# Patient Record
Sex: Female | Born: 1980 | Race: White | Hispanic: No | Marital: Married | Smoking: Never smoker
Health system: Southern US, Community
[De-identification: ages and names within clinical notes are randomized; demographics above are authoritative.]

## PROBLEM LIST (undated history)

## (undated) ENCOUNTER — Inpatient Hospital Stay (HOSPITAL_COMMUNITY): Payer: Self-pay

## (undated) DIAGNOSIS — F329 Major depressive disorder, single episode, unspecified: Secondary | ICD-10-CM

## (undated) DIAGNOSIS — I10 Essential (primary) hypertension: Secondary | ICD-10-CM

## (undated) DIAGNOSIS — B977 Papillomavirus as the cause of diseases classified elsewhere: Secondary | ICD-10-CM

## (undated) DIAGNOSIS — O1490 Unspecified pre-eclampsia, unspecified trimester: Secondary | ICD-10-CM

## (undated) DIAGNOSIS — F419 Anxiety disorder, unspecified: Secondary | ICD-10-CM

## (undated) DIAGNOSIS — F32A Depression, unspecified: Secondary | ICD-10-CM

## (undated) HISTORY — DX: Unspecified pre-eclampsia, unspecified trimester: O14.90

## (undated) HISTORY — DX: Major depressive disorder, single episode, unspecified: F32.9

## (undated) HISTORY — PX: ARM WOUND REPAIR / CLOSURE: SUR1141

## (undated) HISTORY — DX: Papillomavirus as the cause of diseases classified elsewhere: B97.7

## (undated) HISTORY — DX: Depression, unspecified: F32.A

---

## 2000-10-20 ENCOUNTER — Other Ambulatory Visit: Admission: RE | Admit: 2000-10-20 | Discharge: 2000-10-20 | Payer: Self-pay | Admitting: Orthopedic Surgery

## 2003-08-01 ENCOUNTER — Encounter (INDEPENDENT_AMBULATORY_CARE_PROVIDER_SITE_OTHER): Payer: Self-pay | Admitting: Internal Medicine

## 2003-08-01 ENCOUNTER — Other Ambulatory Visit: Admission: RE | Admit: 2003-08-01 | Discharge: 2003-08-01 | Payer: Self-pay | Admitting: Internal Medicine

## 2003-08-01 ENCOUNTER — Other Ambulatory Visit: Admission: RE | Admit: 2003-08-01 | Discharge: 2003-08-01 | Payer: Self-pay | Admitting: Family Medicine

## 2003-08-01 LAB — CONVERTED CEMR LAB: Pap Smear: ABNORMAL

## 2003-08-11 ENCOUNTER — Encounter: Admission: RE | Admit: 2003-08-11 | Discharge: 2003-08-11 | Payer: Self-pay | Admitting: Family Medicine

## 2004-09-03 ENCOUNTER — Emergency Department (HOSPITAL_COMMUNITY): Admission: EM | Admit: 2004-09-03 | Discharge: 2004-09-04 | Payer: Self-pay | Admitting: Emergency Medicine

## 2004-09-04 ENCOUNTER — Ambulatory Visit: Payer: Self-pay | Admitting: Internal Medicine

## 2005-10-31 ENCOUNTER — Ambulatory Visit: Payer: Self-pay | Admitting: Family Medicine

## 2006-07-20 ENCOUNTER — Ambulatory Visit: Payer: Self-pay | Admitting: Family Medicine

## 2006-08-19 ENCOUNTER — Ambulatory Visit: Payer: Self-pay | Admitting: Family Medicine

## 2006-08-19 LAB — CONVERTED CEMR LAB
ALT: 12 units/L (ref 0–40)
AST: 17 units/L (ref 0–37)
Albumin: 3.3 g/dL — ABNORMAL LOW (ref 3.5–5.2)
Alkaline Phosphatase: 54 units/L (ref 39–117)
BUN: 7 mg/dL (ref 6–23)
Basophils Absolute: 0 10*3/uL (ref 0.0–0.1)
Basophils Relative: 0.4 % (ref 0.0–1.0)
Bilirubin, Direct: 0.1 mg/dL (ref 0.0–0.3)
CO2: 28 meq/L (ref 19–32)
Calcium: 8.9 mg/dL (ref 8.4–10.5)
Chloride: 109 meq/L (ref 96–112)
Cholesterol: 150 mg/dL (ref 0–200)
Creatinine, Ser: 0.7 mg/dL (ref 0.4–1.2)
Eosinophils Absolute: 0.1 10*3/uL (ref 0.0–0.6)
Eosinophils Relative: 1.7 % (ref 0.0–5.0)
GFR calc Af Amer: 128 mL/min
GFR calc non Af Amer: 106 mL/min
Glucose, Bld: 90 mg/dL (ref 70–99)
HCT: 39.1 % (ref 36.0–46.0)
HDL: 44 mg/dL (ref >39.0)
Hemoglobin: 13.3 g/dL (ref 12.0–15.0)
LDL Cholesterol: 89 mg/dL (ref 0–99)
Lymphocytes Relative: 15.4 % (ref 12.0–46.0)
MCHC: 34 g/dL (ref 30.0–36.0)
MCV: 76 fL — ABNORMAL LOW (ref 78.0–100.0)
Monocytes Absolute: 0.6 10*3/uL (ref 0.2–0.7)
Monocytes Relative: 7.8 % (ref 3.0–11.0)
Neutro Abs: 5.6 10*3/uL (ref 1.4–7.7)
Neutrophils Relative %: 74.7 % (ref 43.0–77.0)
Platelets: 293 10*3/uL (ref 150–400)
Potassium: 4.3 meq/L (ref 3.5–5.1)
RBC: 5.14 M/uL — ABNORMAL HIGH (ref 3.87–5.11)
RDW: 14 % (ref 11.5–14.6)
Sodium: 142 meq/L (ref 135–145)
TSH: 1.26 microintl units/mL (ref 0.35–5.50)
Total Bilirubin: 0.5 mg/dL (ref 0.3–1.2)
Total CHOL/HDL Ratio: 3.4
Total Protein: 6.4 g/dL (ref 6.0–8.3)
Triglycerides: 87 mg/dL (ref 0–149)
VLDL: 17 mg/dL (ref 0–40)
WBC: 7.4 10*3/uL (ref 4.5–10.5)

## 2007-05-03 ENCOUNTER — Ambulatory Visit: Payer: Self-pay | Admitting: Family Medicine

## 2007-05-03 DIAGNOSIS — F4321 Adjustment disorder with depressed mood: Secondary | ICD-10-CM | POA: Insufficient documentation

## 2007-05-21 ENCOUNTER — Encounter (INDEPENDENT_AMBULATORY_CARE_PROVIDER_SITE_OTHER): Payer: Self-pay | Admitting: Internal Medicine

## 2007-05-21 DIAGNOSIS — Z8619 Personal history of other infectious and parasitic diseases: Secondary | ICD-10-CM | POA: Insufficient documentation

## 2007-06-18 ENCOUNTER — Ambulatory Visit: Payer: Self-pay | Admitting: Family Medicine

## 2007-07-23 ENCOUNTER — Telehealth (INDEPENDENT_AMBULATORY_CARE_PROVIDER_SITE_OTHER): Payer: Self-pay | Admitting: Internal Medicine

## 2007-08-05 ENCOUNTER — Ambulatory Visit: Payer: Self-pay | Admitting: Internal Medicine

## 2007-08-05 DIAGNOSIS — J069 Acute upper respiratory infection, unspecified: Secondary | ICD-10-CM | POA: Insufficient documentation

## 2007-08-31 ENCOUNTER — Telehealth (INDEPENDENT_AMBULATORY_CARE_PROVIDER_SITE_OTHER): Payer: Self-pay | Admitting: Internal Medicine

## 2007-10-11 ENCOUNTER — Emergency Department (HOSPITAL_COMMUNITY): Admission: EM | Admit: 2007-10-11 | Discharge: 2007-10-11 | Payer: Self-pay | Admitting: Family Medicine

## 2008-03-09 ENCOUNTER — Ambulatory Visit: Payer: Self-pay | Admitting: Family Medicine

## 2008-03-09 DIAGNOSIS — M542 Cervicalgia: Secondary | ICD-10-CM | POA: Insufficient documentation

## 2008-03-31 ENCOUNTER — Telehealth (INDEPENDENT_AMBULATORY_CARE_PROVIDER_SITE_OTHER): Payer: Self-pay | Admitting: *Deleted

## 2008-04-24 ENCOUNTER — Emergency Department (HOSPITAL_COMMUNITY): Admission: EM | Admit: 2008-04-24 | Discharge: 2008-04-24 | Payer: Self-pay | Admitting: Emergency Medicine

## 2008-04-28 DIAGNOSIS — G43909 Migraine, unspecified, not intractable, without status migrainosus: Secondary | ICD-10-CM | POA: Insufficient documentation

## 2008-04-28 HISTORY — PX: MRI: SHX5353

## 2008-05-03 ENCOUNTER — Ambulatory Visit: Payer: Self-pay | Admitting: Family Medicine

## 2008-05-04 ENCOUNTER — Encounter (INDEPENDENT_AMBULATORY_CARE_PROVIDER_SITE_OTHER): Payer: Self-pay | Admitting: Internal Medicine

## 2008-06-27 ENCOUNTER — Telehealth (INDEPENDENT_AMBULATORY_CARE_PROVIDER_SITE_OTHER): Payer: Self-pay | Admitting: Internal Medicine

## 2008-07-16 ENCOUNTER — Encounter (INDEPENDENT_AMBULATORY_CARE_PROVIDER_SITE_OTHER): Payer: Self-pay | Admitting: Internal Medicine

## 2009-02-08 ENCOUNTER — Telehealth (INDEPENDENT_AMBULATORY_CARE_PROVIDER_SITE_OTHER): Payer: Self-pay | Admitting: Internal Medicine

## 2009-06-15 ENCOUNTER — Ambulatory Visit: Payer: Self-pay | Admitting: Endocrinology

## 2010-05-30 NOTE — Letter (Signed)
Summary: [facility] / DR. MARSHALL FREEMAN  [facility] / DR. MARSHALL FREEMAN   Imported By: Carin Primrose 05/12/2008 15:36:10  _____________________________________________________________________  External Attachment:    Type:   Image     Comment:   External Document

## 2010-05-30 NOTE — Progress Notes (Signed)
Summary: zoloft refill  Phone Note Refill Request Message from:  [employer] on March 31, 2008 3:25 PM  Refills Requested: Medication #1:  ZOLOFT 100 MG  TABS 1 qd [facility]  Initial call taken by: Liane Comber,  March 31, 2008 3:25 PM  Follow-up for Phone Call        refill x 1 , needs office visit to eval zoloft   Billie-Lynn Tyler Deis FNP  March 31, 2008 3:56 PM   Additional Follow-up for Phone Call Additional follow up Details #1::        Rx faxed to pharmacy Additional Follow-up by: Liane Comber,  March 31, 2008 4:09 PM      Prescriptions: ZOLOFT 100 MG  TABS (SERTRALINE HCL) 1 qd  #30 x 0   Entered and Authorized by:   Gildardo Griffes FNP   Signed by:   Gildardo Griffes FNP on 03/31/2008   Method used:   Telephoned to ...       [facility] [address] [id number]* (retail)       [address] [place], [place]  [address]       Ph: [phone]       Fax: [phone]   RxID:   [id number]

## 2010-05-30 NOTE — Assessment & Plan Note (Signed)
Summary: sore throat/[facility] pt/ok lou to see their pt's today/cd   Vital Signs:  Patient profile:   30 year old female Height:      70 inches (177.80 cm) Weight:      270.38 pounds (122.90 kg) BMI:     38.94 O2 Sat:      99 % on Room air Temp:     96.9 degrees F (36.06 degrees C) oral Pulse rate:   56 / minute BP sitting:   112 / 76  (left arm) Cuff size:   large  Vitals Entered By: Josph Macho RMA (June 15, 2009 3:09 PM)  O2 Flow:  Room air CC: Sore throat, right ear ache, head congestion X2days/ pt states she is no longer taking Flexeril or Vicodin/ CF   CC:  Sore throat, right ear ache, and head congestion X2days/ pt states she is no longer taking Flexeril or Vicodin/ CF.  History of Present Illness: see above, x 1 day.  she has a dry cough.  Current Medications (verified): 1)  Ortho Tri-Cyclen (28) 0.035 Mg  Tabs (Norgestimate-Ethinyl Estradiol) .... Take 1 Tablet By Mouth Once A Day 2)  Zoloft 100 Mg  Tabs (Sertraline Hcl) .... Take One By Mouth Daily 3)  Flexeril 10 Mg Tabs (Cyclobenzaprine Hcl) .Marland Kitchen.. 1 By Mouth At Bedtime 4)  Vicodin 5-500 Mg Tabs (Hydrocodone-Acetaminophen) .Marland Kitchen.. 1 Every 6 Hrs As Needed Headache By Mouth 5)  Atenolol 100 Mg Tabs (Atenolol) .Marland Kitchen.. 1 Tab By Mouth Once Daily 6)  Tizanidine Hcl 2 Mg Tabs (Tizanidine Hcl) .... 1/2 Tablet At Bedtime As Needed For Spasms 7)  Promethazine Hcl 25 Mg Tabs (Promethazine Hcl) .Marland Kitchen.. 1 Tab For Nausea and Vomitting 8)  Maxalt 10 Mg Tabs (Rizatriptan Benzoate) .... As Needed For Migraines  Allergies (verified): No Known Drug Allergies  Past History:  Past Medical History: Last updated: 03/09/2008 Cervical disk disease  Social History: Reviewed history from 05/03/2007 and no changes required. Marital Status: single Children: 0 Occupation: Administrator, [employer] Service, customer service  Review of Systems  The patient denies fever.    Physical Exam  General:  normal appearance.   Head:  head: no  deformity eyes: no periorbital swelling, no proptosis external nose and ears are normal mouth: no lesion seen Ears:  both tm's have fluid.  the right is also red Neck:  Supple without thyroid enlargement or tenderness.  Lungs:  Clear to auscultation bilaterally. Normal respiratory effort.    Impression & Recommendations:  Problem # 1:  URI (ICD-465.9)  Medications Added to Medication List This Visit: 1)  Atenolol 100 Mg Tabs (Atenolol) .Marland Kitchen.. 1 tab by mouth once daily 2)  Tizanidine Hcl 2 Mg Tabs (Tizanidine hcl) .... 1/2 tablet at bedtime as needed for spasms 3)  Promethazine Hcl 25 Mg Tabs (Promethazine hcl) .Marland Kitchen.. 1 tab for nausea and vomitting 4)  Maxalt 10 Mg Tabs (Rizatriptan benzoate) .... As needed for migraines 5)  Cefuroxime Axetil 250 Mg Tabs (Cefuroxime axetil) .Marland Kitchen.. 1 bid 6)  Benzonatate 200 Mg Caps (Benzonatate) .Marland Kitchen.. 1 three times a day as needed cough  Other Orders: Est. Patient Level III (78469)  Patient Instructions: 1)  cefuroxime 250 mg two times a day 2)  loratadine-d as needed for congestion 3)  benzonatate 200 mg three times a day as needed for cough 4)  return as needed Prescriptions: BENZONATATE 200 MG CAPS (BENZONATATE) 1 three times a day as needed cough  #30 x 1   Entered and Authorized by:  Minus Breeding MD   Signed by:   Minus Breeding MD on 06/15/2009   Method used:   [facility]        [facility] [id number]       [address]       [place], [place]  [address]       Ph: [phone] or [phone]       Fax: [phone]   RxID:   [id number] CEFUROXIME AXETIL 250 MG TABS (CEFUROXIME AXETIL) 1 bid  #14 x 0   Entered and Authorized by:   Minus Breeding MD   Signed by:   Minus Breeding MD on 06/15/2009   Method used:   Electronically to        [facility] [facility]retail)       [address]       [place], [place]  [address]       Ph: [phone] or [phone]       Fax: [phone]   RxID:    [id number]

## 2010-05-30 NOTE — Progress Notes (Signed)
Summary: Sertraline refill  Phone Note Refill Request Call back at [phone] Message from:  [facility] on February 08, 2009 2:13 PM  Refills Requested: Medication #1:  ZOLOFT 100 MG  TABS Take one by mouth daily [facility] request refill for Sertraline 100mg . Please advise.    Method Requested: Telephone to Pharmacy Initial call taken by: Lewanda Rife LPN,  February 08, 2009 2:14 PM  Follow-up for Phone Call        will refill x 3 total--needs visit in 1 /2011  Billie-Lynn Tyler Deis FNP  February 08, 2009 5:29 PM   [facility] as instructed. Requested note on Rx for pt to call for appt. Follow-up by: Lewanda Rife LPN,  February 08, 2009 5:36 PM

## 2010-05-30 NOTE — Progress Notes (Signed)
Summary: zoloft rx  Phone Note Refill Request Message from:  [facility] on July 23, 2007 2:53 PM  Refills Requested: Medication #1:  ZOLOFT 100 MG  TABS 1 qd. Initial call taken by: Cooper Render,  July 23, 2007 2:54 PM  Follow-up for Phone Call        refiill x1--needs appt during the month for follow up ..................................................................Marland KitchenGildardo Griffes FNP  July 23, 2007 4:47 PM       Prescriptions: ZOLOFT 100 MG  TABS (SERTRALINE HCL) 1 qd  #30 x 1   Entered by:   Cooper Render   Authorized by:   Gildardo Griffes FNP   Signed by:   Cooper Render on 07/23/2007   Method used:   Electronically [facility] to ...       [facility]  [id number]       [address]       [place], [place]  [address]       Ph: [phone] or [phone]       Fax: [phone]   RxID:   [id number] ZOLOFT 100 MG  TABS (SERTRALINE HCL) 1 qd  #30 x 0   Entered and Authorized by:   Gildardo Griffes FNP   Signed by:   Gildardo Griffes FNP on 07/23/2007   Method used:   Telephoned to ...         RxID:   [id number]     Appended Document: zoloft rx pc to pt advised needs appt w/in the month.

## 2010-05-30 NOTE — Assessment & Plan Note (Signed)
Summary: acute/trouble sleeping/also increse meds/cmt   Vital Signs:  Patient Profile:   30 Years Old Female Weight:      267 pounds Temp:     98.4 degrees F oral Pulse rate:   94 / minute BP sitting:   145 / 103  (left arm) Cuff size:   large  Vitals Entered By: Cooper Render (May 03, 2007 11:08 AM)                 Chief Complaint:  problems sleeping, anxiety, and feels shaky inside.  History of Present Illness: Here due to increased anxiety since death of her father,age 5--COPD.  Difficult to go to sleep--father had lived with her, she slept lightly during this period, father died in her house.  Sleeps better when away from home.  difficult to get back to sleep if wakes during the night. Has been twice to Hospice for grief counseling.  Had a Child psychotherapist when dad living to  help with his care--was able to talk to her better than the grief counselor.  Having difficulty turning her brain off when she goes to bed, feels shaky inside at times.  Current Allergies (reviewed today): No known allergies  Updated/Current Medications (including changes made in today's visit):  ZOLOFT 50 MG  TABS (SERTRALINE HCL) Take 1 tablet by mouth once a day ORTHO TRI-CYCLEN (28) 0.035 MG  TABS (NORGESTIMATE-ETHINYL ESTRADIOL) Take 1 tablet by mouth once a day ZOLOFT 100 MG  TABS (SERTRALINE HCL) 1 qd AMBIEN 10 MG  TABS (ZOLPIDEM TARTRATE) 1/2 to1 at bedtime as needed sleep    Family History:    Father: died at 50--COPD, CHF, HBP,CVA, seizure disorder bypass x2--smoker, lung cancer    Mother: 49--L&W    Siblings: 2 sis--L&W        DM-PGF    MI- MGM, MGF    CVA- 0    Prostate Cancer-0    Breast Cancer-0    Ovarian Cancer-0    Uterine Cancer-0    Colon Cancer-0    Drug/ ETOH Abuse-PGF, MGF    Depression- parents, MGM        PGM--lung ca--smoker  Social History:    Marital Status: single    Children: 0    Occupation: Administrator, [employer] Service    Review of Systems      See HPI    Physical Exam  General:     alert, well-developed, and well-nourished.  NAD Lungs:     normal respiratory effort.   Neurologic:     alert & oriented X3 and gait normal.   Skin:     turgor normal, color normal, and no rashes.   Psych:     normally interactive, not anxious appearing, and not depressed appearing.      Impression & Recommendations:  Problem # 1:  GRIEF REACTION (ICD-309.0) discussed different counselor--she will check into an EAP program at work, if none may want to come here to see Aurea Graff will increase Zoloft to 100mg  once daily see back in 1 mo in follow up not homocidal or suicidal at this time  Problem # 2:  INSOMNIA (ICD-780.52) Assessment: New will use Ambein short term to get back into a more normal sleep pattern Her updated medication list for this problem includes:    Ambien 10 Mg Tabs (Zolpidem tartrate) .Marland Kitchen... 1/2 to1 at bedtime as needed sleep   Complete Medication List: 1)  Zoloft 50 Mg Tabs (Sertraline hcl) .... Take 1 tablet by mouth once a  day 2)  Ortho Tri-cyclen (28) 0.035 Mg Tabs (Norgestimate-ethinyl estradiol) .... Take 1 tablet by mouth once a day 3)  Zoloft 100 Mg Tabs (Sertraline hcl) .Marland Kitchen.. 1 qd 4)  Ambien 10 Mg Tabs (Zolpidem tartrate) .... 1/2 to1 at bedtime as needed sleep   Patient Instructions: 1)  Please schedule a follow-up appointment in 1 month.    Prescriptions: AMBIEN 10 MG  TABS (ZOLPIDEM TARTRATE) 1/2 to1 at bedtime as needed sleep  #30 x 0   Entered and Authorized by:   Gildardo Griffes FNP   Signed by:   Gildardo Griffes FNP on 05/03/2007   Method used:   Print then Give to Patient   RxID:   [id number] ZOLOFT 100 MG  TABS (SERTRALINE HCL) 1 qd  #30 x 1   Entered and Authorized by:   Gildardo Griffes FNP   Signed by:   Gildardo Griffes FNP on 05/03/2007   Method used:   Print then Give to Patient   RxID:   [id number]  ]

## 2010-05-30 NOTE — Assessment & Plan Note (Signed)
Summary: HA/CLE   Vital Signs:  Patient Profile:   30 Years Old Female Weight:      280 pounds Temp:     98.9 degrees F oral Pulse rate:   80 / minute Pulse rhythm:   regular BP sitting:   122 / 90  (left arm) Cuff size:   large  Vitals Entered By: Sydell Axon (May 03, 2008 10:13 AM)                 Chief Complaint:  Headaches, H/O bulging disk that causes the headaches, and wants a referral.  History of Present Illness: Here due to HA--hx of cervical disk disease.  no recent MRI --seen 04/24/08 at [facility] ER for HA--thought to be M/S in origin--was given Vicodan--no xrays --had been seen here by Dr Dallas Schimke 03/09/08 for HA, thought to be M/S or tension in origin    .  was given flexeril and lyrica--did not get Lyrica filled due to cost.  Wants to see neurologist for eval. as having more HA recently.    Current Allergies (reviewed today): No known allergies   Past Medical History:    Reviewed history from 03/09/2008 and no changes required:       Cervical disk disease  Past Surgical History:    Reviewed history from 05/21/2007 and no changes required:       Abn pap- HPV (07/2003)    Risk Factors: Tobacco use:  never  PAP Smear History:    Date of Last PAP Smear:  08/01/2003    Physical Exam  General:     alert, well-developed, well-nourished, well-hydrated, and overweight-appearing.  NAD Psych:     Oriented X3, normally interactive, and good eye contact.      Impression & Recommendations:  Problem # 1:  HEADACHE (ICD-784.0) Assessment: Deteriorated due to increased HA will refer to neurologist for eval will refill vicodin in limited amt for severe pain see back as needed  Orders: [facility]ache [facility] Referral (Headache)  Her updated medication list for this problem includes:    Vicodin 5-500 Mg Tabs (Hydrocodone-acetaminophen) .Marland Kitchen... 1 every 6 hrs as needed headache by mouth   Problem # 2:  CERVICALGIA (ICD-723.1) Assessment: Comment Only well  cocumented in chart Her updated medication list for this problem includes:    Flexeril 10 Mg Tabs (Cyclobenzaprine hcl) .Marland Kitchen... 1 by mouth at bedtime    Vicodin 5-500 Mg Tabs (Hydrocodone-acetaminophen) .Marland Kitchen... 1 every 6 hrs as needed headache by mouth   Complete Medication List: 1)  Ortho Tri-cyclen (28) 0.035 Mg Tabs (Norgestimate-ethinyl estradiol) .... Take 1 tablet by mouth once a day 2)  Zoloft 100 Mg Tabs (Sertraline hcl) .... Take one by mouth daily 3)  Flexeril 10 Mg Tabs (Cyclobenzaprine hcl) .Marland Kitchen.. 1 by mouth at bedtime 4)  Vicodin 5-500 Mg Tabs (Hydrocodone-acetaminophen) .Marland Kitchen.. 1 every 6 hrs as needed headache by mouth   Patient Instructions: 1)  refer to [facility]   Prescriptions: VICODIN 5-500 MG TABS (HYDROCODONE-ACETAMINOPHEN) 1 every 6 hrs as needed headache by mouth  #25 x 0   Entered and Authorized by:   Gildardo Griffes FNP   Signed by:   Gildardo Griffes FNP on 05/03/2008   Method used:   Print then Give to Patient   RxID:   [id number] ZOLOFT 100 MG  TABS (SERTRALINE HCL) Take one by mouth daily  #30 x 6   Entered and Authorized by:   Gildardo Griffes FNP   Signed by:  Billie-Lynn Tyler Deis FNP on 05/03/2008   Method used:   [facility]        [facility] [facility]       [address]       [place], [place]  [address]       Ph: [phone] or [phone]       Fax: [phone]   RxID:   [id number]  ] Current Allergies (reviewed today): No known allergies

## 2010-05-30 NOTE — Assessment & Plan Note (Signed)
Summary: COLD / LFW    History of Present Illness: see scanned note    Current Allergies: No known allergies         Impression & Recommendations:  Problem # 1:  URI (ICD-465.9)  Complete Medication List: 1)  Ortho Tri-cyclen (28) 0.035 Mg Tabs (Norgestimate-ethinyl estradiol) .... Take 1 tablet by mouth once a day 2)  Zoloft 100 Mg Tabs (Sertraline hcl) .Marland Kitchen.. 1 qd     ]

## 2010-07-17 ENCOUNTER — Ambulatory Visit: Payer: Self-pay | Admitting: Neurology

## 2010-11-06 ENCOUNTER — Telehealth: Payer: Self-pay | Admitting: *Deleted

## 2010-11-06 NOTE — Telephone Encounter (Signed)
Agree 

## 2010-11-06 NOTE — Telephone Encounter (Signed)
Marna from pt's gyn office called.  Pt is asking them to refill her BCP's but she was supposed to have returned to them for a 3 month BP check and she didn't.  She told them she had that done here.  I advised them that pt has not been seen in this office since 04/2008.

## 2011-01-17 ENCOUNTER — Encounter: Payer: Self-pay | Admitting: Family Medicine

## 2011-01-20 ENCOUNTER — Encounter: Payer: Self-pay | Admitting: Family Medicine

## 2011-01-20 ENCOUNTER — Ambulatory Visit (INDEPENDENT_AMBULATORY_CARE_PROVIDER_SITE_OTHER): Payer: BC Managed Care – PPO | Admitting: Family Medicine

## 2011-01-20 VITALS — BP 130/82 | HR 80 | Temp 98.7°F | Ht 70.0 in | Wt 279.0 lb

## 2011-01-20 DIAGNOSIS — G43909 Migraine, unspecified, not intractable, without status migrainosus: Secondary | ICD-10-CM

## 2011-01-20 DIAGNOSIS — F4321 Adjustment disorder with depressed mood: Secondary | ICD-10-CM

## 2011-01-20 DIAGNOSIS — I10 Essential (primary) hypertension: Secondary | ICD-10-CM

## 2011-01-20 DIAGNOSIS — Z8679 Personal history of other diseases of the circulatory system: Secondary | ICD-10-CM | POA: Insufficient documentation

## 2011-01-20 LAB — BASIC METABOLIC PANEL
BUN: 8 mg/dL (ref 6–23)
CO2: 20 mEq/L (ref 19–32)
Calcium: 8.1 mg/dL — ABNORMAL LOW (ref 8.4–10.5)
Chloride: 112 mEq/L (ref 96–112)
Creatinine, Ser: 0.7 mg/dL (ref 0.4–1.2)
GFR: 104.5 mL/min (ref >60.00)
Glucose, Bld: 110 mg/dL — ABNORMAL HIGH (ref 70–99)
Potassium: 3.6 mEq/L (ref 3.5–5.1)
Sodium: 140 mEq/L (ref 135–145)

## 2011-01-20 LAB — LIPID PANEL
Cholesterol: 129 mg/dL (ref 0–200)
HDL: 39.8 mg/dL (ref >39.00)
LDL Cholesterol: 71 mg/dL (ref 0–99)
Total CHOL/HDL Ratio: 3
Triglycerides: 92 mg/dL (ref 0.0–149.0)
VLDL: 18.4 mg/dL (ref 0.0–40.0)

## 2011-01-20 LAB — TSH: TSH: 1.46 u[IU]/mL (ref 0.35–5.50)

## 2011-01-20 MED ORDER — ATENOLOL 100 MG PO TABS: 100.0000 mg | ORAL_TABLET | Freq: Every day | ORAL | Status: DC

## 2011-01-20 MED ORDER — SERTRALINE HCL 50 MG PO TABS: 50.0000 mg | ORAL_TABLET | Freq: Every day | ORAL | Status: DC

## 2011-01-20 MED ORDER — TOPIRAMATE 100 MG PO TABS: 100.0000 mg | ORAL_TABLET | Freq: Every day | ORAL | Status: DC

## 2011-01-20 NOTE — Assessment & Plan Note (Signed)
Refilled topamax per pt preference.  discussed importance of avoiding pregnancy on this med.

## 2011-01-20 NOTE — Assessment & Plan Note (Signed)
Chronic, stable. Refilled atenolol. Blood work today. rtc 1 year or as needed.

## 2011-01-20 NOTE — Assessment & Plan Note (Signed)
On zoloft long term. PHQ9= 1 today. Slowly taper off zoloft.

## 2011-01-20 NOTE — Progress Notes (Addendum)
  Subjective:    Patient ID: Amy Costa, female    DOB: 07-Sep-1980, 30 y.o.   MRN: [id number]  HPI CC: med refill  Here today as hasn't been seen since 2010.  Would like atenolol, sertraline and topamax refilled.  Takes sertraline for depression.  Has been on this for several years since father passed away and had difficulty with this.  Wonders if still needs.  Mood has been very stable.  HTN - No HA, vision changes, CP/tightness, SOB, leg swelling.  Tolerates atenolol well.  No recent blood work.  Cervical bulging disk - main cause of headaches per pt.  sees pain doc at [facility].  Seen recently for ESI.  H/o migraines.  Only drank sprite this am. Thinks UTD tetanus, last done here ===>reviewed paper chart, no evidence of Tdap..  Declines flu shot.  Review of Systems Per HPI    Objective:   Physical Exam  Constitutional: She appears well-developed and well-nourished. No distress.  HENT:  Head: Normocephalic and atraumatic.  Mouth/Throat: Oropharynx is clear and moist. No oropharyngeal exudate.  Eyes: Conjunctivae and EOM are normal. Pupils are equal, round, and reactive to light. No scleral icterus.  Neck: Normal range of motion. Neck supple. Carotid bruit is not present.  Cardiovascular: Normal rate, regular rhythm, normal heart sounds and intact distal pulses.   No murmur heard. Pulmonary/Chest: Effort normal and breath sounds normal. No respiratory distress. She has no wheezes. She has no rales.  Abdominal: Soft. There is no tenderness.       No abd/renal bruit  Lymphadenopathy:    She has no cervical adenopathy.  Skin: Skin is warm and dry. No rash noted.          Assessment & Plan:

## 2011-01-20 NOTE — Patient Instructions (Signed)
Lets try and back down on zoloft - go to 50mg  daily for next few weeks (sent in 50mg  dosing).  If doing ok, may back down to 25mg  daily.  If worsening or questions, let us know.  Call us with update in 1 month. Blood work today. I refilled atenolol and topamax.

## 2011-06-24 ENCOUNTER — Other Ambulatory Visit: Payer: Self-pay | Admitting: Family Medicine

## 2011-07-04 ENCOUNTER — Ambulatory Visit (INDEPENDENT_AMBULATORY_CARE_PROVIDER_SITE_OTHER): Payer: BC Managed Care – PPO | Admitting: Family Medicine

## 2011-07-04 ENCOUNTER — Encounter: Payer: Self-pay | Admitting: Family Medicine

## 2011-07-04 VITALS — BP 122/88 | HR 79 | Temp 98.2°F | Wt 274.0 lb

## 2011-07-04 DIAGNOSIS — J069 Acute upper respiratory infection, unspecified: Secondary | ICD-10-CM

## 2011-07-04 MED ORDER — HYDROCODONE-HOMATROPINE 5-1.5 MG/5ML PO SYRP: 5.0000 mL | ORAL_SOLUTION | Freq: Three times a day (TID) | ORAL | Status: DC | PRN

## 2011-07-04 MED ORDER — AZITHROMYCIN 250 MG PO TABS: ORAL_TABLET | ORAL | Status: AC

## 2011-07-04 NOTE — Progress Notes (Signed)
duration of symptoms: ~1 week rhinorrhea:no congestion:yes ear pain:yes sore throat:yes Cough:yes, no sputum Myalgias: no other concerns: some nausea and diarrhea.  No vomiting.  No fevers.   Sx are all worse recently.    ROS: See HPI.  Otherwise negative.    Meds, vitals, and allergies reviewed.   GEN: nad, alert and oriented, dry cough HEENT: mucous membranes moist, TM w/o erythema, nasal epithelium injected, OP with cobblestoning, max sinus ttp- mild NECK: supple w/o LA CV: rrr. PULM: ctab, no inc wob ABD: soft, +bs EXT: no edema

## 2011-07-04 NOTE — Patient Instructions (Signed)
Drink plenty of fluids, take tylenol as needed, and gargle with warm salt water for your throat.  This should gradually improve.  Take care.  Let us know if you have other concerns.   Start the cough medicine today; it can make you drowsy.  Start the antibiotics in a few days if not better.

## 2011-07-07 NOTE — Assessment & Plan Note (Signed)
Nontoxic, supportive tx and start abx in a few days if not better.  D/w pt.  She agrees.

## 2011-07-10 ENCOUNTER — Encounter: Payer: Self-pay | Admitting: Family Medicine

## 2011-07-10 ENCOUNTER — Ambulatory Visit (INDEPENDENT_AMBULATORY_CARE_PROVIDER_SITE_OTHER): Payer: BC Managed Care – PPO | Admitting: Family Medicine

## 2011-07-10 VITALS — BP 126/82 | HR 84 | Temp 98.7°F | Wt 276.5 lb

## 2011-07-10 DIAGNOSIS — J069 Acute upper respiratory infection, unspecified: Secondary | ICD-10-CM

## 2011-07-10 DIAGNOSIS — N926 Irregular menstruation, unspecified: Secondary | ICD-10-CM

## 2011-07-10 DIAGNOSIS — Z3400 Encounter for supervision of normal first pregnancy, unspecified trimester: Secondary | ICD-10-CM

## 2011-07-10 DIAGNOSIS — I10 Essential (primary) hypertension: Secondary | ICD-10-CM

## 2011-07-10 DIAGNOSIS — G43909 Migraine, unspecified, not intractable, without status migrainosus: Secondary | ICD-10-CM

## 2011-07-10 LAB — POCT URINE PREGNANCY: Preg Test, Ur: POSITIVE

## 2011-07-10 LAB — HCG, QUANTITATIVE, PREGNANCY: hCG, Beta Chain, Quant, S: 210.53 m[IU]/mL

## 2011-07-10 MED ORDER — MYNATAL PO CAPS: 1.0000 | ORAL_CAPSULE | Freq: Every day | ORAL | Status: DC

## 2011-07-10 MED ORDER — LABETALOL HCL 200 MG PO TABS: 200.0000 mg | ORAL_TABLET | Freq: Two times a day (BID) | ORAL | Status: DC

## 2011-07-10 NOTE — Assessment & Plan Note (Addendum)
Check Upreg, bHCG. Sent in prenatal vitamins, discussed need to start supplementing folate. Reviewed meds - rec stop gabapentin, topamax, hycodan, atenolol, and methocarbamol. For HTN - start labetalol 200mg  bid.  Monitor bp, if elevated, notify us or OB for titration of labetalol. For migraines - rec minimize vicodin use.  Tylenol ok.  rec f/u with [facility] clinic for further migraine ppx recs. Refer to OB - pt is established.

## 2011-07-10 NOTE — Assessment & Plan Note (Signed)
Change atenolol 100mg  to labetalol 200mg  bid.  Monitor bp and update [place] with numbers for titration.

## 2011-07-10 NOTE — Progress Notes (Signed)
Subjective:    Patient ID: Amy Costa, female    DOB: 01/07/1981, 31 y.o.   MRN: [id number]  HPI CC: ?pregnancy  1wk late for period.  LMP sometime February, thinks 1st or 2nd week.  Usually cycles very regular.  Not trying to get pregnant, surprised.  Unplanned but happy.  Pt feels established and ready to start this new journey.  FOB currently unaware.  Was not on birth control.  2 home tests positive at home.  No abd pain, breast pain.  Mild nausea for last 2 weeks.  Depression - on zoloft for years.  Tried to come off zoloft late last year, had worsening mood so stayed on med.  H/o mgraines and cervical DDD - previously treated with topamax 100mg  qhs, gabapentin 900mg  qhs, robaxin 750mg  tid prn, and vicodin prn.  HTN - prevously well controlled on atenolol 100mg  daily.  H/o HPV.  Recent URTI - treated with hycodan cough syrup - rec stop this.  did not fill zpack.  Medications and allergies reviewed and updated in chart.  Past histories reviewed and updated if relevant as below. Patient Active Problem List  Diagnoses  . HPV  . GRIEF REACTION  . MIGRAINE HEADACHE  . CERVICALGIA  . HEADACHE  . HTN (hypertension)  . URI (upper respiratory infection)   Past Medical History  Diagnosis Date  . DDD (degenerative disc disease), cervical   . Migraine     Dr. Yves Dill at [facility]  . Insomnia, unspecified   . HPV (human papilloma virus) infection   . Depression     on zoloft for years   No past surgical history on file. History  Substance Use Topics  . Smoking status: Never Smoker   . Smokeless tobacco: Not on file  . Alcohol Use: Yes     Occasional   Family History  Problem Relation Age of Onset  . COPD Father     + smoker  . Heart failure Father     CHF  . Hypertension Father   . Stroke Father   . Seizures Father   . Lung cancer Father     + smoker  . Healthy Mother   . Healthy Sister   . Healthy Sister   . Diabetes Paternal Grandfather   .  Heart attack Maternal Grandmother   . Heart attack Maternal Grandfather   . Alcohol abuse Paternal Grandfather   . Alcohol abuse Maternal Grandfather   . Depression Father   . Depression Mother   . Depression Maternal Grandmother   . Lung cancer Paternal Grandmother     + smoker   No Known Allergies Current Outpatient Prescriptions on File Prior to Visit  Medication Sig Dispense Refill  . HYDROcodone-acetaminophen (VICODIN) 5-500 MG per tablet Take 1 tablet by mouth every 6 (six) hours as needed.        . sertraline (ZOLOFT) 50 MG tablet TAKE 1 TABLET (50 MG TOTAL) BY MOUTH DAILY.  30 tablet  3  . azithromycin (ZITHROMAX Z-PAK) 250 MG tablet Take 2 tablets (500 mg) on  Day 1,  followed by 1 tablet (250 mg) once daily on Days 2 through 5.  6 each  0   Review of Systems Per HPI    Objective:   Physical Exam  Nursing note and vitals reviewed. Constitutional: She appears well-developed and well-nourished. No distress.  HENT:  Head: Normocephalic and atraumatic.  Mouth/Throat: Oropharynx is clear and moist. No oropharyngeal exudate.  Eyes: Conjunctivae and EOM are  normal. Pupils are equal, round, and reactive to light. No scleral icterus.  Neck: Normal range of motion. Neck supple.  Cardiovascular: Normal rate, regular rhythm, normal heart sounds and intact distal pulses.   No murmur heard. Pulmonary/Chest: Effort normal and breath sounds normal. No respiratory distress. She has no wheezes. She has no rales.  Abdominal: Soft. Bowel sounds are normal. She exhibits no distension and no mass. There is no tenderness. There is no rebound and no guarding.  Musculoskeletal: She exhibits no edema.  Skin: Skin is warm and dry. No rash noted.  Psychiatric: She has a normal mood and affect.       Assessment & Plan:

## 2011-07-10 NOTE — Assessment & Plan Note (Signed)
Did not need zpack.  rec stop hycodan.

## 2011-07-10 NOTE — Assessment & Plan Note (Signed)
See below - stop ppx meds, minimize vicodin, stop robaxin.  F/u with migraine MD.

## 2011-07-10 NOTE — Patient Instructions (Addendum)
Stop all medicines.  Ok to continue zoloft. Start labetalol 200mg  twice daily. Keep eye on blood pressure and let us know how it is doing while you get in to see OBGYN Start prenatal vitamin - goal folate is 400-800 mcg daily. Drink plenty of water. Schedule appointment with OBGYN.

## 2011-07-28 LAB — US OP OB COMP LESS 14 WKS

## 2011-08-01 ENCOUNTER — Inpatient Hospital Stay (HOSPITAL_COMMUNITY): Payer: BC Managed Care – PPO

## 2011-08-01 ENCOUNTER — Encounter (HOSPITAL_COMMUNITY): Payer: Self-pay | Admitting: *Deleted

## 2011-08-01 ENCOUNTER — Inpatient Hospital Stay (HOSPITAL_COMMUNITY)
Admission: AD | Admit: 2011-08-01 | Discharge: 2011-08-02 | Disposition: A | Discharge status: Home / Self Care | Payer: BC Managed Care – PPO | Source: Ambulatory Visit | Attending: Obstetrics and Gynecology | Admitting: Obstetrics and Gynecology

## 2011-08-01 DIAGNOSIS — O26859 Spotting complicating pregnancy, unspecified trimester: Secondary | ICD-10-CM | POA: Insufficient documentation

## 2011-08-01 DIAGNOSIS — O468X9 Other antepartum hemorrhage, unspecified trimester: Secondary | ICD-10-CM

## 2011-08-01 DIAGNOSIS — R109 Unspecified abdominal pain: Secondary | ICD-10-CM | POA: Insufficient documentation

## 2011-08-01 DIAGNOSIS — O418X90 Other specified disorders of amniotic fluid and membranes, unspecified trimester, not applicable or unspecified: Secondary | ICD-10-CM

## 2011-08-01 HISTORY — DX: Essential (primary) hypertension: I10

## 2011-08-01 LAB — URINALYSIS, ROUTINE W REFLEX MICROSCOPIC
Bilirubin Urine: NEGATIVE
Glucose, UA: NEGATIVE mg/dL
Ketones, ur: NEGATIVE mg/dL
Leukocytes, UA: NEGATIVE
Nitrite: POSITIVE — AB
Protein, ur: 30 mg/dL — AB
Specific Gravity, Urine: >1.03 — ABNORMAL HIGH (ref 1.005–1.030)
Urobilinogen, UA: 0.2 mg/dL (ref 0.0–1.0)
pH: 6 (ref 5.0–8.0)

## 2011-08-01 LAB — URINE MICROSCOPIC-ADD ON

## 2011-08-01 NOTE — MAU Note (Signed)
Pt states, " I was leaving work and I felt a gush,; I went to the bathroom and blood covered the inside of my panties and there was a nickle size clot."

## 2011-08-01 NOTE — MAU Provider Note (Signed)
History     CSN: [id number]  Arrival date and time: 08/01/11 2043   None     Chief Complaint  Patient presents with  . Vaginal Bleeding   HPI Pt is G1P0 [redacted]w[redacted]d pregnant and presents with spotting in pregnancy and some cramping.  She had an ultrasound in the office with viable IUP with cardiac activity. She saw Dr. Marcelle Overlie at that time. She last had IC on Saturday. She has been nauseated and has had a GI virus with diarrhea and nausea and vomiting for several days that has resolved.  She didn't run a fever.  She denies pain with urination.  Past Medical History  Diagnosis Date  . DDD (degenerative disc disease), cervical   . Migraine     Dr. Yves Dill at [facility]  . Insomnia, unspecified   . HPV (human papilloma virus) infection   . Depression     on zoloft for years  . Hypertension     Past Surgical History  Procedure Date  . Arm wound repair / closure     Family History  Problem Relation Age of Onset  . COPD Father     + smoker  . Heart failure Father     CHF  . Hypertension Father   . Stroke Father   . Seizures Father   . Lung cancer Father     + smoker  . Healthy Mother   . Healthy Sister   . Healthy Sister   . Diabetes Paternal Grandfather   . Heart attack Maternal Grandmother   . Heart attack Maternal Grandfather   . Alcohol abuse Paternal Grandfather   . Alcohol abuse Maternal Grandfather   . Depression Father   . Depression Mother   . Depression Maternal Grandmother   . Lung cancer Paternal Grandmother     + smoker    History  Substance Use Topics  . Smoking status: Never Smoker   . Smokeless tobacco: Never Used  . Alcohol Use: Yes     Occasional    Allergies: No Known Allergies  Prescriptions prior to admission  Medication Sig Dispense Refill  . gabapentin (NEURONTIN) 300 MG capsule Take 300 mg by mouth every morning.      . labetalol (NORMODYNE) 200 MG tablet Take 1 tablet (200 mg total) by mouth 2 (two) times daily.  60 tablet  3    . methocarbamol (ROBAXIN) 750 MG tablet Take 750 mg by mouth 3 (three) times daily.      . Prenatal Multivit-Min-Fe-FA (MYNATAL) CAPS Take 1 capsule by mouth daily.  90 each  1  . sertraline (ZOLOFT) 50 MG tablet TAKE 1 TABLET (50 MG TOTAL) BY MOUTH DAILY.  30 tablet  3  . topiramate (TOPAMAX) 100 MG tablet Take 100 mg by mouth every morning.      Marland Kitchen HYDROcodone-acetaminophen (VICODIN) 5-500 MG per tablet Take 1 tablet by mouth every 6 (six) hours as needed.          Review of Systems  Constitutional: Negative for fever and chills.  HENT:       Every day  Gastrointestinal: Positive for abdominal pain. Negative for heartburn, nausea, vomiting, diarrhea and constipation.  Genitourinary: Negative for dysuria and urgency.  Neurological: Positive for headaches. Negative for dizziness.   Physical Exam   Blood pressure 128/79, pulse 84, temperature 98.8 F (37.1 C), temperature source Oral, resp. rate 20, height 6\' 9"  (2.057 m), weight 272 lb 8 oz (123.605 kg), last menstrual period 06/10/2011.  Physical  Exam  Vitals reviewed. Constitutional: She is oriented to person, place, and time. She appears well-developed and well-nourished.  HENT:  Head: Normocephalic.  Eyes: Pupils are equal, round, and reactive to light.  Neck: Normal range of motion. Neck supple.  Cardiovascular: Normal rate.   Respiratory: Effort normal.  GI: Soft.  Genitourinary:       Mod amount of bright red blood in vault- no clotting; cervix closed; uterus and adnexal nontender without appreciable enlargement  Musculoskeletal: Normal range of motion.  Neurological: She is alert and oriented to person, place, and time.  Skin: Skin is warm and dry.  Psychiatric: She has a normal mood and affect.    MAU Course  Procedures Ultrasound ordered Care turned over to Wynelle Bourgeois, CNM    Assessment and Plan    LINEBERRY,SUSAN 08/01/2011, 10:36 PM  *RADIOLOGY REPORT*  Clinical Data: Bleeding, cramping  OBSTETRIC  <14 WK [place] AND TRANSVAGINAL OB US  Technique: Both transabdominal and transvaginal ultrasound  examinations were performed for complete evaluation of the  gestation as well as the maternal uterus, adnexal regions, and  pelvic cul-de-sac. Transvaginal technique was performed to assess  early pregnancy.  Comparison: None.  Intrauterine gestational sac: Visualized/normal in shape.  Yolk sac: Present  Embryo: Present  Cardiac Activity: Present  Heart Rate: 167 bpm  CRL: Measures 1.53 cm, with estimated gestational age of [redacted] weeks 0  days  [place] EDC: 03/13/2012  Maternal uterus/adnexae:  Small subchronic hemorrhage.  Right ovary is not visualized.  Left ovary is within normal limits, measuring 2.5 x 1.9 x 1.8 cm.  No free fluid.  IMPRESSION:  Single live intrauterine gestation with estimated gestational age [redacted]  weeks 0 days by crown-rump length.  Discussed with Dr Renaldo Fiddler Will d/c home on pelvic rest. Followup inoffice

## 2011-08-02 ENCOUNTER — Inpatient Hospital Stay (HOSPITAL_COMMUNITY): Payer: BC Managed Care – PPO

## 2011-08-02 ENCOUNTER — Other Ambulatory Visit: Payer: Self-pay | Admitting: Advanced Practice Midwife

## 2011-08-02 MED ORDER — CEPHALEXIN 500 MG PO CAPS: 500.0000 mg | ORAL_CAPSULE | Freq: Four times a day (QID) | ORAL | Status: AC

## 2011-08-02 NOTE — Discharge Instructions (Signed)
Pelvic Rest Pelvic rest is sometimes recommended for women when:   The placenta is partially or completely covering the opening of the cervix (placenta previa).   There is bleeding between the uterine wall and the amniotic sac in the first trimester (subchorionic hemorrhage).   The cervix begins to open without labor starting (incompetent cervix, cervical insufficiency).   The labor is too early (preterm labor).  HOME CARE INSTRUCTIONS  Do not have sexual intercourse, stimulation, or an orgasm.   Do not use tampons, douche, or put anything in the vagina.   Do not lift anything over 10 pounds (4.5 kg).   Avoid strenuous activity or straining your pelvic muscles.  SEEK MEDICAL CARE IF:  You have any vaginal bleeding during pregnancy. Treat this as a potential emergency.   You have cramping pain felt low in the stomach (stronger than menstrual cramps).   You notice vaginal discharge (watery, mucus, or bloody).   You have a low, dull backache.   There are regular contractions or uterine tightening.  SEEK IMMEDIATE MEDICAL CARE IF: You have vaginal bleeding and have placenta previa.  Document Released: 08/09/2010 Document Revised: 04/03/2011 Document Reviewed: 08/09/2010 [employer] Patient Information 2012 [employer].Vaginal Bleeding During Pregnancy A small amount of bleeding from the vagina can happen anytime during pregnancy. Be sure to tell your doctor about all vaginal bleeding.  HOME CARE  Get plenty of rest and sleep.   Count the number of pads you use each day. Do not use tampons.   Save any tissue you pass for your doctor to see.   Do not exercise   Do not do any heavy lifting.   Avoid going up and down stairs. If you must climb stairs, go slowly.   Do not have sex (intercourse) or orgasms until approved by your doctor.   Do not douche.   Only take medicine as told by your doctor. Do not take aspirin.   Eat healthy.   Always keep your follow-up  appointments.  GET HELP RIGHT AWAY IF:   You feel the baby moving less or not moving at all.   The bleeding gets worse.   You have very painful cramps or pain in your stomach or back.   You pass large clots or anything that looks like tissue.   You have a temperature by mouth above 102 F (38.9 C).   You feel very weak.   You have chills.   You feel dizzy or pass out (faint).   You have a gush of fluid from the vagina.  MAKE SURE YOU:   Understand these instructions.   Will watch your condition.   Will get help right away if you are not doing well or get worse.  Document Released: 01/22/2008 Document Revised: 04/03/2011 Document Reviewed: 03/20/2009 Exit[employer] Patient Information 2012 [employer]. 

## 2011-08-05 LAB — OB RESULTS CONSOLE HIV ANTIBODY (ROUTINE TESTING): HIV: NONREACTIVE

## 2011-08-05 LAB — OB RESULTS CONSOLE RUBELLA ANTIBODY, IGM: Rubella: IMMUNE

## 2011-08-05 LAB — OB RESULTS CONSOLE ABO/RH: RH Type: POSITIVE

## 2011-08-05 LAB — OB RESULTS CONSOLE RPR: RPR: NONREACTIVE

## 2011-08-05 LAB — OB RESULTS CONSOLE HEPATITIS B SURFACE ANTIGEN: Hepatitis B Surface Ag: NEGATIVE

## 2011-08-05 LAB — OB RESULTS CONSOLE ANTIBODY SCREEN: Antibody Screen: NEGATIVE

## 2011-11-11 ENCOUNTER — Other Ambulatory Visit (HOSPITAL_COMMUNITY): Payer: Self-pay | Admitting: Obstetrics and Gynecology

## 2011-11-11 DIAGNOSIS — IMO0002 Reserved for concepts with insufficient information to code with codable children: Secondary | ICD-10-CM

## 2011-11-11 DIAGNOSIS — Z0489 Encounter for examination and observation for other specified reasons: Secondary | ICD-10-CM

## 2011-11-19 ENCOUNTER — Encounter (HOSPITAL_COMMUNITY): Payer: Self-pay

## 2011-11-19 ENCOUNTER — Ambulatory Visit (HOSPITAL_COMMUNITY)
Admission: RE | Admit: 2011-11-19 | Discharge: 2011-11-19 | Disposition: A | Discharge status: Home / Self Care | Payer: BC Managed Care – PPO | Source: Ambulatory Visit | Attending: Obstetrics and Gynecology | Admitting: Obstetrics and Gynecology

## 2011-11-19 ENCOUNTER — Other Ambulatory Visit: Payer: Self-pay | Admitting: Obstetrics and Gynecology

## 2011-11-19 DIAGNOSIS — E669 Obesity, unspecified: Secondary | ICD-10-CM | POA: Insufficient documentation

## 2011-11-19 DIAGNOSIS — O358XX0 Maternal care for other (suspected) fetal abnormality and damage, not applicable or unspecified: Secondary | ICD-10-CM | POA: Insufficient documentation

## 2011-11-19 DIAGNOSIS — Z363 Encounter for antenatal screening for malformations: Secondary | ICD-10-CM | POA: Insufficient documentation

## 2011-11-19 DIAGNOSIS — Z1389 Encounter for screening for other disorder: Secondary | ICD-10-CM | POA: Insufficient documentation

## 2011-11-19 DIAGNOSIS — IMO0002 Reserved for concepts with insufficient information to code with codable children: Secondary | ICD-10-CM

## 2011-11-19 DIAGNOSIS — Z0489 Encounter for examination and observation for other specified reasons: Secondary | ICD-10-CM

## 2011-11-24 ENCOUNTER — Encounter: Payer: Self-pay | Admitting: Obstetrics and Gynecology

## 2011-12-09 ENCOUNTER — Other Ambulatory Visit (HOSPITAL_COMMUNITY): Payer: Self-pay | Admitting: Obstetrics and Gynecology

## 2011-12-09 DIAGNOSIS — IMO0002 Reserved for concepts with insufficient information to code with codable children: Secondary | ICD-10-CM

## 2011-12-09 DIAGNOSIS — Z0489 Encounter for examination and observation for other specified reasons: Secondary | ICD-10-CM

## 2011-12-11 ENCOUNTER — Ambulatory Visit (HOSPITAL_COMMUNITY)
Admission: RE | Admit: 2011-12-11 | Discharge: 2011-12-11 | Disposition: A | Discharge status: Home / Self Care | Payer: BC Managed Care – PPO | Source: Ambulatory Visit | Attending: Obstetrics and Gynecology | Admitting: Obstetrics and Gynecology

## 2011-12-11 VITALS — BP 142/85 | HR 92 | Wt 278.5 lb

## 2011-12-11 DIAGNOSIS — Z0489 Encounter for examination and observation for other specified reasons: Secondary | ICD-10-CM

## 2011-12-11 DIAGNOSIS — O10019 Pre-existing essential hypertension complicating pregnancy, unspecified trimester: Secondary | ICD-10-CM | POA: Insufficient documentation

## 2011-12-11 DIAGNOSIS — E669 Obesity, unspecified: Secondary | ICD-10-CM | POA: Insufficient documentation

## 2011-12-11 DIAGNOSIS — I10 Essential (primary) hypertension: Secondary | ICD-10-CM

## 2011-12-11 DIAGNOSIS — IMO0002 Reserved for concepts with insufficient information to code with codable children: Secondary | ICD-10-CM

## 2011-12-11 DIAGNOSIS — Z363 Encounter for antenatal screening for malformations: Secondary | ICD-10-CM | POA: Insufficient documentation

## 2011-12-11 DIAGNOSIS — O358XX0 Maternal care for other (suspected) fetal abnormality and damage, not applicable or unspecified: Secondary | ICD-10-CM | POA: Insufficient documentation

## 2011-12-11 DIAGNOSIS — Z1389 Encounter for screening for other disorder: Secondary | ICD-10-CM | POA: Insufficient documentation

## 2011-12-11 NOTE — Progress Notes (Signed)
Amy Costa was seen for ultrasound appointment today.  Please see [facility] report for details.

## 2011-12-18 ENCOUNTER — Encounter (HOSPITAL_COMMUNITY): Payer: Self-pay | Admitting: *Deleted

## 2011-12-18 ENCOUNTER — Inpatient Hospital Stay (HOSPITAL_COMMUNITY)
Admission: AD | Admit: 2011-12-18 | Discharge: 2011-12-18 | Disposition: A | Discharge status: Home / Self Care | Payer: BC Managed Care – PPO | Source: Ambulatory Visit | Attending: Obstetrics and Gynecology | Admitting: Obstetrics and Gynecology

## 2011-12-18 DIAGNOSIS — O99891 Other specified diseases and conditions complicating pregnancy: Secondary | ICD-10-CM | POA: Insufficient documentation

## 2011-12-18 DIAGNOSIS — G43909 Migraine, unspecified, not intractable, without status migrainosus: Secondary | ICD-10-CM

## 2011-12-18 LAB — URINE MICROSCOPIC-ADD ON

## 2011-12-18 LAB — URINALYSIS, ROUTINE W REFLEX MICROSCOPIC
Bilirubin Urine: NEGATIVE
Glucose, UA: NEGATIVE mg/dL
Ketones, ur: NEGATIVE mg/dL
Nitrite: NEGATIVE
Protein, ur: NEGATIVE mg/dL
Specific Gravity, Urine: 1.015 (ref 1.005–1.030)
Urobilinogen, UA: 0.2 mg/dL (ref 0.0–1.0)
pH: 7 (ref 5.0–8.0)

## 2011-12-18 MED ORDER — LACTATED RINGERS IV BOLUS (SEPSIS): 1000.0000 mL | Freq: Once | INTRAVENOUS | Status: DC

## 2011-12-18 MED ORDER — DEXAMETHASONE SODIUM PHOSPHATE 10 MG/ML IJ SOLN
10.0000 mg | Freq: Once | INTRAMUSCULAR | Status: AC
  Administered 2011-12-18: 10 mg via INTRAVENOUS
  Filled 2011-12-18: qty 1

## 2011-12-18 MED ORDER — PROMETHAZINE HCL 25 MG/ML IJ SOLN
12.5000 mg | Freq: Once | INTRAMUSCULAR | Status: AC
  Administered 2011-12-18: 12.5 mg via INTRAVENOUS
  Filled 2011-12-18: qty 1

## 2011-12-18 MED ORDER — METOCLOPRAMIDE HCL 5 MG/ML IJ SOLN
10.0000 mg | Freq: Once | INTRAMUSCULAR | Status: AC
  Administered 2011-12-18: 10 mg via INTRAVENOUS
  Filled 2011-12-18: qty 2

## 2011-12-18 NOTE — MAU Provider Note (Signed)
History     CSN: [id number]  Arrival date and time: 12/18/11 1153   None     Chief Complaint  Patient presents with  . Headache  . Migraine   HPI 31 y.o. G1P0 at [redacted]w[redacted]d with left sided/frontal headache x 3 days, no vision changes or abdominal pain. H/O migraines outside of pregnancy, states this feels like her usual migraines. No relief with tylenol or hydrocodone. Chronic hypertensive, on labetalol.    Past Medical History  Diagnosis Date  . DDD (degenerative disc disease), cervical   . Migraine     Dr. Yves Dill at [facility]  . Insomnia, unspecified   . HPV (human papilloma virus) infection   . Depression     on zoloft for years  . Hypertension     Past Surgical History  Procedure Date  . Arm wound repair / closure     Family History  Problem Relation Age of Onset  . COPD Father     + smoker  . Heart failure Father     CHF  . Hypertension Father   . Stroke Father   . Seizures Father   . Lung cancer Father     + smoker  . Healthy Mother   . Healthy Sister   . Healthy Sister   . Diabetes Paternal Grandfather   . Heart attack Maternal Grandmother   . Heart attack Maternal Grandfather   . Alcohol abuse Paternal Grandfather   . Alcohol abuse Maternal Grandfather   . Depression Father   . Depression Mother   . Depression Maternal Grandmother   . Lung cancer Paternal Grandmother     + smoker    History  Substance Use Topics  . Smoking status: Never Smoker   . Smokeless tobacco: Never Used  . Alcohol Use: No     Occasional    Allergies: No Known Allergies  Prescriptions prior to admission  Medication Sig Dispense Refill  . labetalol (NORMODYNE) 200 MG tablet Take 1 tablet (200 mg total) by mouth 2 (two) times daily.  60 tablet  3  . Prenatal Multivit-Min-Fe-FA (MYNATAL) CAPS Take 1 capsule by mouth daily.  90 each  1    Review of Systems  Constitutional: Negative.   Eyes: Negative for blurred vision.  Respiratory: Negative.     Cardiovascular: Negative.   Gastrointestinal: Negative for nausea, vomiting, abdominal pain, diarrhea and constipation.  Genitourinary: Negative for dysuria, urgency, frequency, hematuria and flank pain.       Negative for vaginal bleeding, cramping/contractions  Musculoskeletal: Negative.   Neurological: Positive for headaches.  Psychiatric/Behavioral: Negative.    Physical Exam   Blood pressure 132/85, pulse 104, temperature 97.7 F (36.5 C), temperature source Oral, resp. rate 20, height 5\' 10"  (1.778 m), weight 276 lb (125.193 kg), last menstrual period 06/04/2011, SpO2 98.00%, unknown if currently breastfeeding.  Physical Exam  Nursing note and vitals reviewed. Constitutional: She is oriented to person, place, and time. She appears well-developed and well-nourished. No distress.  HENT:  Head: Normocephalic and atraumatic.  Cardiovascular: Normal rate.   Respiratory: Effort normal.  GI: Soft. There is no tenderness.  Musculoskeletal: Normal range of motion.  Neurological: She is alert and oriented to person, place, and time.  Skin: Skin is warm and dry.  Psychiatric: She has a normal mood and affect.    MAU Course  Procedures  Results for orders placed during the hospital encounter of 12/18/11 (from the past 24 hour(s))  URINALYSIS, ROUTINE W REFLEX MICROSCOPIC  Status: Abnormal   Collection Time   12/18/11 12:08 PM      Component Value Range   Color, Urine YELLOW  YELLOW   APPearance HAZY (*) CLEAR   Specific Gravity, Urine 1.015  1.005 - 1.030   pH 7.0  5.0 - 8.0   Glucose, UA NEGATIVE  NEGATIVE mg/dL   Hgb urine dipstick TRACE (*) NEGATIVE   Bilirubin Urine NEGATIVE  NEGATIVE   Ketones, ur NEGATIVE  NEGATIVE mg/dL   Protein, ur NEGATIVE  NEGATIVE mg/dL   Urobilinogen, UA 0.2  0.0 - 1.0 mg/dL   Nitrite NEGATIVE  NEGATIVE   Leukocytes, UA LARGE (*) NEGATIVE  URINE MICROSCOPIC-ADD ON     Status: Abnormal   Collection Time   12/18/11 12:08 PM      Component  Value Range   Squamous Epithelial / LPF FEW (*) RARE   WBC, UA 21-50  <3 WBC/hpf   RBC / HPF 7-10  <3 RBC/hpf   Bacteria, UA MANY (*) RARE   Urine-Other MUCOUS PRESENT     Urine culture ordered  Assessment and Plan  31 y.o. G1P0 at [redacted]w[redacted]d Migraine - resolved with IV hydration, Reglan 10 mg, Decadron 10 mg, Phenergan 12.5 mg IV F/U as scheduled, PIH precautions rev'd  FRAZIER,NATALIE 12/18/2011, 12:28 PM

## 2011-12-18 NOTE — MAU Note (Signed)
Pt states headache started Tuesday of this week and is not relieved by Tylenol or Hydrocodone.  No vaginal bleeding or ROM.  Good fetal movement.

## 2011-12-19 LAB — URINE CULTURE: Colony Count: 80000

## 2012-01-06 ENCOUNTER — Inpatient Hospital Stay (HOSPITAL_COMMUNITY)
Admission: AD | Admit: 2012-01-06 | Discharge: 2012-01-06 | Disposition: A | Discharge status: Home / Self Care | Payer: BC Managed Care – PPO | Source: Ambulatory Visit | Attending: Obstetrics and Gynecology | Admitting: Obstetrics and Gynecology

## 2012-01-06 ENCOUNTER — Other Ambulatory Visit (HOSPITAL_COMMUNITY): Payer: Self-pay | Admitting: Maternal and Fetal Medicine

## 2012-01-06 ENCOUNTER — Encounter (HOSPITAL_COMMUNITY): Payer: Self-pay

## 2012-01-06 ENCOUNTER — Ambulatory Visit (HOSPITAL_COMMUNITY)
Admission: RE | Admit: 2012-01-06 | Discharge: 2012-01-06 | Disposition: A | Discharge status: Home / Self Care | Payer: BC Managed Care – PPO | Source: Ambulatory Visit | Attending: Obstetrics and Gynecology | Admitting: Obstetrics and Gynecology

## 2012-01-06 VITALS — BP 137/96 | HR 100 | Wt 285.5 lb

## 2012-01-06 DIAGNOSIS — IMO0002 Reserved for concepts with insufficient information to code with codable children: Secondary | ICD-10-CM

## 2012-01-06 DIAGNOSIS — O10019 Pre-existing essential hypertension complicating pregnancy, unspecified trimester: Secondary | ICD-10-CM | POA: Insufficient documentation

## 2012-01-06 DIAGNOSIS — E669 Obesity, unspecified: Secondary | ICD-10-CM | POA: Insufficient documentation

## 2012-01-06 DIAGNOSIS — Z0489 Encounter for examination and observation for other specified reasons: Secondary | ICD-10-CM

## 2012-01-06 DIAGNOSIS — I10 Essential (primary) hypertension: Secondary | ICD-10-CM

## 2012-01-06 DIAGNOSIS — O99210 Obesity complicating pregnancy, unspecified trimester: Secondary | ICD-10-CM | POA: Insufficient documentation

## 2012-01-06 MED ORDER — BETAMETHASONE SOD PHOS & ACET 6 (3-3) MG/ML IJ SUSP
12.0000 mg | Freq: Once | INTRAMUSCULAR | Status: AC
  Administered 2012-01-06: 12 mg via INTRAMUSCULAR
  Filled 2012-01-06: qty 2

## 2012-01-06 NOTE — Progress Notes (Signed)
Amy Costa  was seen today for an ultrasound appointment.  See full report in [facility].  Alpha Gula, MD  Single IUP at 30 6/7 weeks Overall EFW is currently at the 20th %tile; however all biometric measuremensts < 10th %tile.  [facility] measures < 3rd %tile Elevated umbilical artery S/D ratios for gestational age.  No evidence of absent or reversed diastolic flow Normal amniotic fluid volume The fetus is active with a BPP of 8/8  Recommend betamethasone series for fetal lung maturity Weekly BPPs / umbilical artery Doppler studies Begin 2x weekly testing at 32 weeks (BPP / Doppler studies here and NSTs at referring office)

## 2012-01-06 NOTE — MAU Note (Signed)
No adverse reaction 

## 2012-01-07 ENCOUNTER — Inpatient Hospital Stay (HOSPITAL_COMMUNITY)
Admission: AD | Admit: 2012-01-07 | Discharge: 2012-01-07 | Disposition: A | Discharge status: Home / Self Care | Payer: BC Managed Care – PPO | Source: Ambulatory Visit | Attending: Obstetrics and Gynecology | Admitting: Obstetrics and Gynecology

## 2012-01-07 ENCOUNTER — Encounter (HOSPITAL_COMMUNITY): Payer: Self-pay | Admitting: *Deleted

## 2012-01-07 DIAGNOSIS — O4700 False labor before 37 completed weeks of gestation, unspecified trimester: Secondary | ICD-10-CM | POA: Insufficient documentation

## 2012-01-07 MED ORDER — BETAMETHASONE SOD PHOS & ACET 6 (3-3) MG/ML IJ SUSP
12.0000 mg | Freq: Once | INTRAMUSCULAR | Status: AC
  Administered 2012-01-07: 12 mg via INTRAMUSCULAR
  Filled 2012-01-07: qty 2

## 2012-01-14 ENCOUNTER — Encounter (HOSPITAL_COMMUNITY): Payer: Self-pay

## 2012-01-14 ENCOUNTER — Ambulatory Visit (HOSPITAL_COMMUNITY)
Admission: RE | Admit: 2012-01-14 | Discharge: 2012-01-14 | Disposition: A | Discharge status: Home / Self Care | Payer: BC Managed Care – PPO | Source: Ambulatory Visit | Attending: Obstetrics and Gynecology | Admitting: Obstetrics and Gynecology

## 2012-01-14 ENCOUNTER — Encounter (HOSPITAL_COMMUNITY): Payer: Self-pay | Admitting: *Deleted

## 2012-01-14 ENCOUNTER — Inpatient Hospital Stay (HOSPITAL_COMMUNITY)
Admission: AD | Admit: 2012-01-14 | Discharge: 2012-01-14 | Disposition: A | Discharge status: Home / Self Care | Payer: BC Managed Care – PPO | Source: Ambulatory Visit | Attending: Obstetrics and Gynecology | Admitting: Obstetrics and Gynecology

## 2012-01-14 VITALS — BP 175/115 | HR 78 | Wt 290.0 lb

## 2012-01-14 DIAGNOSIS — E669 Obesity, unspecified: Secondary | ICD-10-CM | POA: Insufficient documentation

## 2012-01-14 DIAGNOSIS — O10019 Pre-existing essential hypertension complicating pregnancy, unspecified trimester: Secondary | ICD-10-CM

## 2012-01-14 DIAGNOSIS — O365990 Maternal care for other known or suspected poor fetal growth, unspecified trimester, not applicable or unspecified: Secondary | ICD-10-CM | POA: Insufficient documentation

## 2012-01-14 DIAGNOSIS — O10919 Unspecified pre-existing hypertension complicating pregnancy, unspecified trimester: Secondary | ICD-10-CM

## 2012-01-14 DIAGNOSIS — O99210 Obesity complicating pregnancy, unspecified trimester: Secondary | ICD-10-CM | POA: Insufficient documentation

## 2012-01-14 DIAGNOSIS — IMO0002 Reserved for concepts with insufficient information to code with codable children: Secondary | ICD-10-CM

## 2012-01-14 DIAGNOSIS — I10 Essential (primary) hypertension: Secondary | ICD-10-CM

## 2012-01-14 LAB — URINALYSIS, ROUTINE W REFLEX MICROSCOPIC
Bilirubin Urine: NEGATIVE
Glucose, UA: NEGATIVE mg/dL
Hgb urine dipstick: NEGATIVE
Ketones, ur: NEGATIVE mg/dL
Nitrite: NEGATIVE
Protein, ur: NEGATIVE mg/dL
Specific Gravity, Urine: <1.005 — ABNORMAL LOW (ref 1.005–1.030)
Urobilinogen, UA: 0.2 mg/dL (ref 0.0–1.0)
pH: 7 (ref 5.0–8.0)

## 2012-01-14 LAB — COMPREHENSIVE METABOLIC PANEL
ALT: 7 U/L (ref 0–35)
AST: 15 U/L (ref 0–37)
Albumin: 2.6 g/dL — ABNORMAL LOW (ref 3.5–5.2)
Alkaline Phosphatase: 89 U/L (ref 39–117)
BUN: 6 mg/dL (ref 6–23)
CO2: 23 mEq/L (ref 19–32)
Calcium: 8.9 mg/dL (ref 8.4–10.5)
Chloride: 104 mEq/L (ref 96–112)
Creatinine, Ser: 0.55 mg/dL (ref 0.50–1.10)
GFR calc Af Amer: >90 mL/min (ref >90)
GFR calc non Af Amer: >90 mL/min (ref >90)
Glucose, Bld: 80 mg/dL (ref 70–99)
Potassium: 4.3 mEq/L (ref 3.5–5.1)
Sodium: 137 mEq/L (ref 135–145)
Total Bilirubin: 0.3 mg/dL (ref 0.3–1.2)
Total Protein: 5.9 g/dL — ABNORMAL LOW (ref 6.0–8.3)

## 2012-01-14 LAB — CBC
HCT: 37.3 % (ref 36.0–46.0)
Hemoglobin: 12.1 g/dL (ref 12.0–15.0)
MCH: 25.9 pg — ABNORMAL LOW (ref 26.0–34.0)
MCHC: 32.4 g/dL (ref 30.0–36.0)
MCV: 79.9 fL (ref 78.0–100.0)
Platelets: 208 10*3/uL (ref 150–400)
RBC: 4.67 MIL/uL (ref 3.87–5.11)
RDW: 14.3 % (ref 11.5–15.5)
WBC: 10.8 10*3/uL — ABNORMAL HIGH (ref 4.0–10.5)

## 2012-01-14 LAB — PROTEIN / CREATININE RATIO, URINE
Creatinine, Urine: 35.24 mg/dL
Protein Creatinine Ratio: 0.28 — ABNORMAL HIGH (ref 0.00–0.15)
Total Protein, Urine: 9.8 mg/dL

## 2012-01-14 LAB — URINE MICROSCOPIC-ADD ON

## 2012-01-14 MED ORDER — OXYCODONE-ACETAMINOPHEN 5-325 MG PO TABS
1.0000 | ORAL_TABLET | Freq: Once | ORAL | Status: AC
  Administered 2012-01-14: 1 via ORAL
  Filled 2012-01-14: qty 1

## 2012-01-14 MED ORDER — LABETALOL HCL 100 MG PO TABS
200.0000 mg | ORAL_TABLET | Freq: Once | ORAL | Status: AC
  Administered 2012-01-14: 200 mg via ORAL
  Filled 2012-01-14: qty 2

## 2012-01-14 MED ORDER — OXYCODONE HCL 5 MG PO TABS
5.0000 mg | ORAL_TABLET | Freq: Once | ORAL | Status: AC
  Administered 2012-01-14: 5 mg via ORAL
  Filled 2012-01-14: qty 1

## 2012-01-14 NOTE — MAU Note (Signed)
Patient was seen in [facility]aternal [facility]etal Medicine today for ultrasound. Blood pressure elevated and sent to MAU for evaluation. Patient is on Labetalol but did not take am dose. Denies any bleeding, contractions or leaking and reports good fetal movement. Has had a headache for a couple of days.

## 2012-01-14 NOTE — MAU Provider Note (Signed)
Chief Complaint:  Hypertension and Headache    First Provider Initiated Contact with Patient 01/14/12 1151      Amy Costa is  31 y.o. G1P0000.  Patient's last menstrual period was 06/04/2011.Marland Kitchen  Her pregnancy status is positive. [redacted]w[redacted]d  She presents complaining of Hypertension and Headache  Pt was sent from MFM for further evaluation of elevated BP and Headache. Pt reports daily headaches unrelieved by vicodin. Denies visual disturbance, epigastric pain, nausea or vomiting.  Obstetrical/Gynecological History: OB History    Grav Para Term Preterm Abortions TAB SAB Ect Mult Living   1 0 0 0 0 0 0 0 0 0       Past Medical History: Past Medical History  Diagnosis Date  . DDD (degenerative disc disease), cervical   . Migraine     Dr. Yves Dill at [facility]  . Insomnia, unspecified   . HPV (human papilloma virus) infection   . Depression     on zoloft for years  . Hypertension     Past Surgical History: Past Surgical History  Procedure Date  . Arm wound repair / closure     Family History: Family History  Problem Relation Age of Onset  . COPD Father     + smoker  . Heart failure Father     CHF  . Hypertension Father   . Stroke Father   . Seizures Father   . Lung cancer Father     + smoker  . Healthy Mother   . Healthy Sister   . Healthy Sister   . Diabetes Paternal Grandfather   . Heart attack Maternal Grandmother   . Heart attack Maternal Grandfather   . Alcohol abuse Paternal Grandfather   . Alcohol abuse Maternal Grandfather   . Depression Father   . Depression Mother   . Depression Maternal Grandmother   . Lung cancer Paternal Grandmother     + smoker    Social History: History  Substance Use Topics  . Smoking status: Never Smoker   . Smokeless tobacco: Never Used  . Alcohol Use: No     Occasional    Allergies: No Known Allergies  Prescriptions prior to admission  Medication Sig Dispense Refill  . HYDROcodone-acetaminophen  (NORCO/VICODIN) 5-325 MG per tablet Take 1 tablet by mouth every 6 (six) hours as needed. pain      . labetalol (NORMODYNE) 200 MG tablet Take 1 tablet (200 mg total) by mouth 2 (two) times daily.  60 tablet  3    Review of Systems - History obtained from the patient General ROS: negative for - chills, fatigue or fever Endocrine ROS: negative  Respiratory ROS: no cough, shortness of breath, or wheezing Cardiovascular ROS: no chest pain or dyspnea on exertion Gastrointestinal ROS: no abdominal pain, change in bowel habits, or black or bloody stools Genito-Urinary ROS: no dysuria, trouble voiding, or hematuria negative for - vulvar/vaginal symptoms Neurological ROS: positive for - headaches negative for - dizziness, numbness/tingling, speech problems, tremors, visual changes or weakness  Physical Exam   Blood pressure 118/70, pulse 56, temperature 98.1 F (36.7 C), temperature source Oral, resp. rate 20, height 5' 10.5" (1.791 m), weight 287 lb 9.6 oz (130.455 kg), last menstrual period 06/04/2011, SpO2 99.00%.  General: General appearance - alert, well appearing, and in no distress, oriented to person, place, and time and overweight Mental status - alert, oriented to person, place, and time, normal mood, behavior, speech, dress, motor activity, and thought processes, affect appropriate to mood Eyes -  left eye normal, right eye normal Abdomen - obese, gravid, soft, non tender Neurological - alert, oriented, normal speech, no focal findings or movement disorder noted, screening mental status exam normal, cranial nerves II through XII intact, brisk reflexes BL, 2+pitting edema of lower ext Focused Gynecological Exam: examination not indicated  Labs: Recent Results (from the past 24 hour(s))  CBC   Collection Time   01/14/12 12:10 PM      Component Value Range   WBC 10.8 (*) 4.0 - 10.5 K/uL   RBC 4.67  3.87 - 5.11 MIL/uL   Hemoglobin 12.1  12.0 - 15.0 g/dL   HCT 40.9  81.1 - 91.4 %    MCV 79.9  78.0 - 100.0 fL   MCH 25.9 (*) 26.0 - 34.0 pg   MCHC 32.4  30.0 - 36.0 g/dL   RDW 78.2  95.6 - 21.3 %   Platelets 208  150 - 400 K/uL  COMPREHENSIVE METABOLIC PANEL   Collection Time   01/14/12 12:10 PM      Component Value Range   Sodium 137  135 - 145 mEq/L   Potassium 4.3  3.5 - 5.1 mEq/L   Chloride 104  96 - 112 mEq/L   CO2 23  19 - 32 mEq/L   Glucose, Bld 80  70 - 99 mg/dL   BUN 6  6 - 23 mg/dL   Creatinine, Ser 0.86  0.50 - 1.10 mg/dL   Calcium 8.9  8.4 - 57.8 mg/dL   Total Protein 5.9 (*) 6.0 - 8.3 g/dL   Albumin 2.6 (*) 3.5 - 5.2 g/dL   AST 15  0 - 37 U/L   ALT 7  0 - 35 U/L   Alkaline Phosphatase 89  39 - 117 U/L   Total Bilirubin 0.3  0.3 - 1.2 mg/dL   GFR calc non Af Amer >90  >90 mL/min   GFR calc Af Amer >90  >90 mL/min  PROTEIN / CREATININE RATIO, URINE   Collection Time   01/14/12 12:25 PM      Component Value Range   Creatinine, Urine 35.24     Total Protein, Urine 9.8     PROTEIN CREATININE RATIO 0.28 (*) 0.00 - 0.15  URINALYSIS, ROUTINE W REFLEX MICROSCOPIC   Collection Time   01/14/12 12:25 PM      Component Value Range   Color, Urine YELLOW  YELLOW   APPearance CLEAR  CLEAR   Specific Gravity, Urine <1.005 (*) 1.005 - 1.030   pH 7.0  5.0 - 8.0   Glucose, UA NEGATIVE  NEGATIVE mg/dL   Hgb urine dipstick NEGATIVE  NEGATIVE   Bilirubin Urine NEGATIVE  NEGATIVE   Ketones, ur NEGATIVE  NEGATIVE mg/dL   Protein, ur NEGATIVE  NEGATIVE mg/dL   Urobilinogen, UA 0.2  0.0 - 1.0 mg/dL   Nitrite NEGATIVE  NEGATIVE   Leukocytes, UA SMALL (*) NEGATIVE  URINE MICROSCOPIC-ADD ON   Collection Time   01/14/12 12:25 PM      Component Value Range   Squamous Epithelial / LPF FEW (*) RARE   WBC, UA 3-6  <3 WBC/hpf  1:41pm Discussed with Dr. Renaldo Fiddler  2:50pm Dr. Renaldo Fiddler updated with lab results.   Assessment: Chronic HTN, antepartum  Plan: Discharge home 24 hour urine FU in office on Friday for visit and return 24 hour urine  Amy Costa  E. 01/14/2012,2:51 PM

## 2012-01-14 NOTE — Progress Notes (Signed)
Amy Costa  was seen today for an ultrasound appointment.  See full report in [facility].  Alpha Gula, MD  [name] was seen for follow up due to elevated umbilical artery Doppler studies and borderline IUGR / asymmetric fetal growth.  BP today was 168/111, repeat 172/115 The patient completed a course of betamethasone last week  Single IUP at 32 0/7 weeks Ultrasound on 9/10 - overall EFW at the 20th%tile, but AC and all biometric measurements < 10th% (asymmetric growth) Elevated umbilical artery S/D ratios for gestational age.  No evidence of absent or reversed diastolic flow Subjectively low amniotic fluiv volume The fetus is active with a BPP of 8/8  Patient referred to MAU for further evaluation due to elevated blood pressures.  Discussed with Dr. Renaldo Fiddler If not hospitalized, recommend 2x weekly testing (NSTs weekly at referring physicians office / weekly BPP Doppler studies here) and follow up growth scan in 1 week.

## 2012-01-14 NOTE — ED Notes (Signed)
Pt taken to MAU to be evaluated for elevated BP's.  Consulting civil engineer given report.

## 2012-01-18 ENCOUNTER — Inpatient Hospital Stay (HOSPITAL_COMMUNITY)
Admission: AD | Admit: 2012-01-18 | Discharge: 2012-01-18 | Disposition: A | Discharge status: Home / Self Care | Payer: BC Managed Care – PPO | Source: Ambulatory Visit | Attending: Obstetrics and Gynecology | Admitting: Obstetrics and Gynecology

## 2012-01-18 ENCOUNTER — Encounter (HOSPITAL_COMMUNITY): Payer: Self-pay | Admitting: Obstetrics and Gynecology

## 2012-01-18 DIAGNOSIS — O169 Unspecified maternal hypertension, unspecified trimester: Secondary | ICD-10-CM

## 2012-01-18 DIAGNOSIS — O139 Gestational [pregnancy-induced] hypertension without significant proteinuria, unspecified trimester: Secondary | ICD-10-CM

## 2012-01-18 LAB — URINALYSIS, ROUTINE W REFLEX MICROSCOPIC
Bilirubin Urine: NEGATIVE
Glucose, UA: NEGATIVE mg/dL
Hgb urine dipstick: NEGATIVE
Ketones, ur: NEGATIVE mg/dL
Nitrite: NEGATIVE
Protein, ur: NEGATIVE mg/dL
Specific Gravity, Urine: 1.015 (ref 1.005–1.030)
Urobilinogen, UA: 0.2 mg/dL (ref 0.0–1.0)
pH: 7 (ref 5.0–8.0)

## 2012-01-18 LAB — URIC ACID: Uric Acid, Serum: 4.9 mg/dL (ref 2.4–7.0)

## 2012-01-18 LAB — CBC
HCT: 37.1 % (ref 36.0–46.0)
Hemoglobin: 12 g/dL (ref 12.0–15.0)
MCH: 26.1 pg (ref 26.0–34.0)
MCHC: 32.3 g/dL (ref 30.0–36.0)
MCV: 80.8 fL (ref 78.0–100.0)
Platelets: 221 10*3/uL (ref 150–400)
RBC: 4.59 MIL/uL (ref 3.87–5.11)
RDW: 14.5 % (ref 11.5–15.5)
WBC: 11 10*3/uL — ABNORMAL HIGH (ref 4.0–10.5)

## 2012-01-18 LAB — COMPREHENSIVE METABOLIC PANEL
ALT: 9 U/L (ref 0–35)
AST: 13 U/L (ref 0–37)
Albumin: 2.6 g/dL — ABNORMAL LOW (ref 3.5–5.2)
Alkaline Phosphatase: 103 U/L (ref 39–117)
BUN: 8 mg/dL (ref 6–23)
CO2: 25 mEq/L (ref 19–32)
Calcium: 9 mg/dL (ref 8.4–10.5)
Chloride: 103 mEq/L (ref 96–112)
Creatinine, Ser: 0.6 mg/dL (ref 0.50–1.10)
GFR calc Af Amer: >90 mL/min (ref >90)
GFR calc non Af Amer: >90 mL/min (ref >90)
Glucose, Bld: 85 mg/dL (ref 70–99)
Potassium: 3.8 mEq/L (ref 3.5–5.1)
Sodium: 136 mEq/L (ref 135–145)
Total Bilirubin: 0.3 mg/dL (ref 0.3–1.2)
Total Protein: 5.6 g/dL — ABNORMAL LOW (ref 6.0–8.3)

## 2012-01-18 LAB — URINE MICROSCOPIC-ADD ON

## 2012-01-18 LAB — LACTATE DEHYDROGENASE: LDH: 187 U/L (ref 94–250)

## 2012-01-18 MED ORDER — CYCLOBENZAPRINE HCL 10 MG PO TABS
10.0000 mg | ORAL_TABLET | Freq: Once | ORAL | Status: AC
  Administered 2012-01-18: 10 mg via ORAL
  Filled 2012-01-18: qty 1

## 2012-01-18 MED ORDER — OXYCODONE-ACETAMINOPHEN 5-325 MG PO TABS
1.0000 | ORAL_TABLET | Freq: Once | ORAL | Status: AC
  Administered 2012-01-18: 1 via ORAL
  Filled 2012-01-18: qty 1

## 2012-01-18 MED ORDER — CYCLOBENZAPRINE HCL 10 MG PO TABS: 10.0000 mg | ORAL_TABLET | Freq: Three times a day (TID) | ORAL | Status: DC | PRN

## 2012-01-18 NOTE — MAU Provider Note (Signed)
History     CSN: [id number]  Arrival date and time: 01/18/12 1541   First Provider Initiated Contact with Patient 01/18/12 1615      Chief Complaint  Patient presents with  . Headache   HPI 31 y.o. G1P0000 at [redacted]w[redacted]d with elevated BP at home, states it was 180/120 on her home BP monitor. + headache, no vision changes or abdominal pain. Headache has not been relieved with tylenol. PIH eval earlier this week in MAU. + fetal movement, no contractions, bleeding, LOF.    Past Medical History  Diagnosis Date  . DDD (degenerative disc disease), cervical   . Migraine     Dr. Yves Dill at [place] [facility]  . Insomnia, unspecified   . HPV (human papilloma virus) infection   . Depression     on zoloft for years  . Hypertension     Past Surgical History  Procedure Date  . Arm wound repair / closure     Family History  Problem Relation Age of Onset  . COPD Father     + smoker  . Heart failure Father     CHF  . Hypertension Father   . Stroke Father   . Seizures Father   . Lung cancer Father     + smoker  . Healthy Mother   . Healthy Sister   . Healthy Sister   . Diabetes Paternal Grandfather   . Heart attack Maternal Grandmother   . Heart attack Maternal Grandfather   . Alcohol abuse Paternal Grandfather   . Alcohol abuse Maternal Grandfather   . Depression Father   . Depression Mother   . Depression Maternal Grandmother   . Lung cancer Paternal Grandmother     + smoker    History  Substance Use Topics  . Smoking status: Never Smoker   . Smokeless tobacco: Never Used  . Alcohol Use: No     Occasional    Allergies: No Known Allergies  Prescriptions prior to admission  Medication Sig Dispense Refill  . acetaminophen (TYLENOL) 500 MG tablet Take 1,000 mg by mouth every 6 (six) hours as needed. pain      . HYDROcodone-acetaminophen (NORCO/VICODIN) 5-325 MG per tablet Take 1 tablet by mouth every 6 (six) hours as needed. pain      . labetalol (NORMODYNE) 200 MG  tablet Take 1 tablet (200 mg total) by mouth 2 (two) times daily.  60 tablet  3    Review of Systems  Constitutional: Negative.   Eyes: Negative for blurred vision.  Respiratory: Negative.   Cardiovascular: Negative.   Gastrointestinal: Negative for nausea, vomiting, abdominal pain, diarrhea and constipation.  Genitourinary: Negative for dysuria, urgency, frequency, hematuria and flank pain.       Negative for vaginal bleeding, vaginal discharge, dyspareunia  Musculoskeletal: Negative.   Neurological: Positive for headaches.  Psychiatric/Behavioral: Negative.    Physical Exam   Blood pressure 155/84, pulse 69, temperature 98.7 F (37.1 C), resp. rate 18, height 5' 10.5" (1.791 m), weight 290 lb 3.2 oz (131.634 kg), last menstrual period 06/04/2011.  Filed Vitals:   01/18/12 1612 01/18/12 1613 01/18/12 1616 01/18/12 1631  BP: 152/86 152/86 159/87 155/84  Pulse: 67 66 65 69  Temp:      Resp:  18    Height:      Weight:       Filed Vitals:   01/18/12 1702 01/18/12 1716 01/18/12 1738 01/18/12 1746  BP: 138/71 156/90 136/61 129/63  Pulse: 66 66 63 61  Temp:      Resp:      Height:      Weight:         Physical Exam  Nursing note and vitals reviewed. Constitutional: She is oriented to person, place, and time. She appears well-developed and well-nourished. No distress.  Cardiovascular: Normal rate and regular rhythm.   Respiratory: Effort normal.  GI: Soft. There is no tenderness.  Musculoskeletal: Normal range of motion.  Neurological: She is alert and oriented to person, place, and time. She has normal reflexes.  Skin: Skin is warm and dry.  Psychiatric: She has a normal mood and affect.   EFM reactive, TOCO quiet MAU Course  Procedures  Results for orders placed during the hospital encounter of 01/18/12 (from the past 24 hour(s))  CBC     Status: Abnormal   Collection Time   01/18/12  3:53 PM      Component Value Range   WBC 11.0 (*) 4.0 - 10.5 K/uL   RBC 4.59   3.87 - 5.11 MIL/uL   Hemoglobin 12.0  12.0 - 15.0 g/dL   HCT 25.3  66.4 - 40.3 %   MCV 80.8  78.0 - 100.0 fL   MCH 26.1  26.0 - 34.0 pg   MCHC 32.3  30.0 - 36.0 g/dL   RDW 47.4  25.9 - 56.3 %   Platelets 221  150 - 400 K/uL  COMPREHENSIVE METABOLIC PANEL     Status: Abnormal   Collection Time   01/18/12  3:53 PM      Component Value Range   Sodium 136  135 - 145 mEq/L   Potassium 3.8  3.5 - 5.1 mEq/L   Chloride 103  96 - 112 mEq/L   CO2 25  19 - 32 mEq/L   Glucose, Bld 85  70 - 99 mg/dL   BUN 8  6 - 23 mg/dL   Creatinine, Ser 8.75  0.50 - 1.10 mg/dL   Calcium 9.0  8.4 - 64.3 mg/dL   Total Protein 5.6 (*) 6.0 - 8.3 g/dL   Albumin 2.6 (*) 3.5 - 5.2 g/dL   AST 13  0 - 37 U/L   ALT 9  0 - 35 U/L   Alkaline Phosphatase 103  39 - 117 U/L   Total Bilirubin 0.3  0.3 - 1.2 mg/dL   GFR calc non Af Amer >90  >90 mL/min   GFR calc Af Amer >90  >90 mL/min  URIC ACID     Status: Normal   Collection Time   01/18/12  3:53 PM      Component Value Range   Uric Acid, Serum 4.9  2.4 - 7.0 mg/dL  LACTATE DEHYDROGENASE     Status: Normal   Collection Time   01/18/12  3:53 PM      Component Value Range   LDH 187  94 - 250 U/L  URINALYSIS, ROUTINE W REFLEX MICROSCOPIC     Status: Abnormal   Collection Time   01/18/12  4:00 PM      Component Value Range   Color, Urine YELLOW  YELLOW   APPearance CLEAR  CLEAR   Specific Gravity, Urine 1.015  1.005 - 1.030   pH 7.0  5.0 - 8.0   Glucose, UA NEGATIVE  NEGATIVE mg/dL   Hgb urine dipstick NEGATIVE  NEGATIVE   Bilirubin Urine NEGATIVE  NEGATIVE   Ketones, ur NEGATIVE  NEGATIVE mg/dL   Protein, ur NEGATIVE  NEGATIVE mg/dL   Urobilinogen, UA  0.2  0.0 - 1.0 mg/dL   Nitrite NEGATIVE  NEGATIVE   Leukocytes, UA TRACE (*) NEGATIVE  URINE MICROSCOPIC-ADD ON     Status: Normal   Collection Time   01/18/12  4:00 PM      Component Value Range   Squamous Epithelial / LPF RARE  RARE   WBC, UA 0-2  <3 WBC/hpf   Bacteria, UA RARE  RARE      .  oxyCODONE-acetaminophen  1 tablet Oral Once     Assessment and Plan  31 y.o. G1P0000 1. PIH (pregnancy induced hypertension)    Follow up in office as scheduled Precautions rev'd  Shacara Cozine 01/18/2012, 4:59 PM

## 2012-01-18 NOTE — MAU Note (Signed)
Pt reports she has been worked up for [facility] earlier this week. States she has not felt good all day, has a headache and has taken her B/P at home and it was 180/120. Reports good fetal movement

## 2012-01-21 ENCOUNTER — Inpatient Hospital Stay (HOSPITAL_COMMUNITY)
Admission: AD | Admit: 2012-01-21 | Discharge: 2012-01-25 | DRG: 651 | Disposition: A | Discharge status: Home / Self Care | Payer: BC Managed Care – PPO | Source: Ambulatory Visit | Attending: Obstetrics and Gynecology | Admitting: Obstetrics and Gynecology

## 2012-01-21 ENCOUNTER — Inpatient Hospital Stay (HOSPITAL_COMMUNITY)
Admission: RE | Admit: 2012-01-21 | Discharge: 2012-01-21 | DRG: 651 | Disposition: A | Discharge status: Home / Self Care | Payer: BC Managed Care – PPO | Source: Ambulatory Visit | Attending: Obstetrics and Gynecology | Admitting: Obstetrics and Gynecology

## 2012-01-21 ENCOUNTER — Encounter (HOSPITAL_COMMUNITY): Payer: Self-pay | Admitting: Anesthesiology

## 2012-01-21 ENCOUNTER — Encounter (HOSPITAL_COMMUNITY)
Admission: AD | Disposition: A | Discharge status: Home / Self Care | Payer: Self-pay | Source: Ambulatory Visit | Attending: Obstetrics and Gynecology

## 2012-01-21 ENCOUNTER — Encounter (HOSPITAL_COMMUNITY): Payer: Self-pay | Admitting: *Deleted

## 2012-01-21 ENCOUNTER — Inpatient Hospital Stay (HOSPITAL_COMMUNITY): Payer: BC Managed Care – PPO | Admitting: Anesthesiology

## 2012-01-21 DIAGNOSIS — O34599 Maternal care for other abnormalities of gravid uterus, unspecified trimester: Secondary | ICD-10-CM | POA: Diagnosis present

## 2012-01-21 DIAGNOSIS — D4959 Neoplasm of unspecified behavior of other genitourinary organ: Secondary | ICD-10-CM | POA: Diagnosis present

## 2012-01-21 DIAGNOSIS — IMO0002 Reserved for concepts with insufficient information to code with codable children: Principal | ICD-10-CM | POA: Diagnosis present

## 2012-01-21 DIAGNOSIS — O365990 Maternal care for other known or suspected poor fetal growth, unspecified trimester, not applicable or unspecified: Secondary | ICD-10-CM | POA: Diagnosis present

## 2012-01-21 DIAGNOSIS — I10 Essential (primary) hypertension: Secondary | ICD-10-CM

## 2012-01-21 DIAGNOSIS — D252 Subserosal leiomyoma of uterus: Secondary | ICD-10-CM | POA: Diagnosis present

## 2012-01-21 DIAGNOSIS — O4100X0 Oligohydramnios, unspecified trimester, not applicable or unspecified: Secondary | ICD-10-CM | POA: Diagnosis present

## 2012-01-21 HISTORY — PX: MYOMECTOMY: SHX85

## 2012-01-21 LAB — CBC
HCT: 36.5 % (ref 36.0–46.0)
Hemoglobin: 11.9 g/dL — ABNORMAL LOW (ref 12.0–15.0)
MCH: 26.2 pg (ref 26.0–34.0)
MCHC: 32.6 g/dL (ref 30.0–36.0)
MCV: 80.2 fL (ref 78.0–100.0)
Platelets: 195 10*3/uL (ref 150–400)
RBC: 4.55 MIL/uL (ref 3.87–5.11)
RDW: 14.4 % (ref 11.5–15.5)
WBC: 9.6 10*3/uL (ref 4.0–10.5)

## 2012-01-21 LAB — ABO/RH: ABO/RH(D): O POS

## 2012-01-21 LAB — MRSA PCR SCREENING: MRSA by PCR: NEGATIVE

## 2012-01-21 LAB — TYPE AND SCREEN
ABO/RH(D): O POS
Antibody Screen: NEGATIVE

## 2012-01-21 SURGERY — Surgical Case
Anesthesia: Spinal | Site: Abdomen | Wound class: Clean Contaminated

## 2012-01-21 MED ORDER — OXYTOCIN 10 UNIT/ML IJ SOLN
40.0000 [IU] | INTRAVENOUS | Status: DC | PRN
  Administered 2012-01-21: 40 [IU] via INTRAVENOUS

## 2012-01-21 MED ORDER — FLEET ENEMA 7-19 GM/118ML RE ENEM: 1.0000 | ENEMA | Freq: Every day | RECTAL | Status: DC | PRN

## 2012-01-21 MED ORDER — LACTATED RINGERS IV SOLN
INTRAVENOUS | Status: DC
  Administered 2012-01-21: 14:00:00 via INTRAVENOUS

## 2012-01-21 MED ORDER — DIPHENHYDRAMINE HCL 25 MG PO CAPS: 25.0000 mg | ORAL_CAPSULE | Freq: Four times a day (QID) | ORAL | Status: DC | PRN

## 2012-01-21 MED ORDER — MORPHINE SULFATE (PF) 0.5 MG/ML IJ SOLN
INTRAMUSCULAR | Status: DC | PRN
  Administered 2012-01-21: 4.85 mg via INTRAVENOUS

## 2012-01-21 MED ORDER — DIPHENHYDRAMINE HCL 50 MG/ML IJ SOLN: 25.0000 mg | INTRAMUSCULAR | Status: DC | PRN

## 2012-01-21 MED ORDER — PHENYLEPHRINE HCL 10 MG/ML IJ SOLN
INTRAMUSCULAR | Status: DC | PRN
  Administered 2012-01-21: 80 ug via INTRAVENOUS

## 2012-01-21 MED ORDER — NALBUPHINE HCL 10 MG/ML IJ SOLN: 5.0000 mg | INTRAMUSCULAR | Status: DC | PRN

## 2012-01-21 MED ORDER — DIPHENHYDRAMINE HCL 25 MG PO CAPS
25.0000 mg | ORAL_CAPSULE | ORAL | Status: DC | PRN
  Filled 2012-01-21: qty 1

## 2012-01-21 MED ORDER — FENTANYL CITRATE 0.05 MG/ML IJ SOLN
INTRAMUSCULAR | Status: AC
  Filled 2012-01-21: qty 2

## 2012-01-21 MED ORDER — HYDROMORPHONE HCL PF 1 MG/ML IJ SOLN
0.2500 mg | INTRAMUSCULAR | Status: DC | PRN
  Administered 2012-01-21 (×2): 0.5 mg via INTRAVENOUS

## 2012-01-21 MED ORDER — BISACODYL 10 MG RE SUPP: 10.0000 mg | Freq: Every day | RECTAL | Status: DC | PRN

## 2012-01-21 MED ORDER — SCOPOLAMINE 1 MG/3DAYS TD PT72
1.0000 | MEDICATED_PATCH | Freq: Once | TRANSDERMAL | Status: AC
  Administered 2012-01-21: 1.5 mg via TRANSDERMAL

## 2012-01-21 MED ORDER — PRENATAL MULTIVITAMIN CH: 1.0000 | ORAL_TABLET | Freq: Every day | ORAL | Status: DC

## 2012-01-21 MED ORDER — METOCLOPRAMIDE HCL 5 MG/ML IJ SOLN: 10.0000 mg | Freq: Three times a day (TID) | INTRAMUSCULAR | Status: DC | PRN

## 2012-01-21 MED ORDER — SODIUM CHLORIDE 0.9 % IJ SOLN: 3.0000 mL | INTRAMUSCULAR | Status: DC | PRN

## 2012-01-21 MED ORDER — ZOLPIDEM TARTRATE 5 MG PO TABS: 5.0000 mg | ORAL_TABLET | Freq: Every evening | ORAL | Status: DC | PRN

## 2012-01-21 MED ORDER — ACETAMINOPHEN 325 MG PO TABS: 650.0000 mg | ORAL_TABLET | ORAL | Status: DC | PRN

## 2012-01-21 MED ORDER — MORPHINE SULFATE 0.5 MG/ML IJ SOLN
INTRAMUSCULAR | Status: AC
  Filled 2012-01-21: qty 10

## 2012-01-21 MED ORDER — ONDANSETRON HCL 4 MG/2ML IJ SOLN: 4.0000 mg | INTRAMUSCULAR | Status: DC | PRN

## 2012-01-21 MED ORDER — CALCIUM CARBONATE ANTACID 500 MG PO CHEW: 2.0000 | CHEWABLE_TABLET | ORAL | Status: DC | PRN

## 2012-01-21 MED ORDER — OXYCODONE-ACETAMINOPHEN 5-325 MG PO TABS
1.0000 | ORAL_TABLET | Freq: Four times a day (QID) | ORAL | Status: DC | PRN
  Administered 2012-01-22 – 2012-01-23 (×4): 2 via ORAL
  Administered 2012-01-23: 1 via ORAL
  Filled 2012-01-21 (×3): qty 2
  Filled 2012-01-21: qty 1
  Filled 2012-01-21 (×2): qty 2

## 2012-01-21 MED ORDER — SIMETHICONE 80 MG PO CHEW: 80.0000 mg | CHEWABLE_TABLET | ORAL | Status: DC | PRN

## 2012-01-21 MED ORDER — SCOPOLAMINE 1 MG/3DAYS TD PT72
MEDICATED_PATCH | TRANSDERMAL | Status: AC
  Administered 2012-01-21: 1.5 mg via TRANSDERMAL
  Filled 2012-01-21: qty 1

## 2012-01-21 MED ORDER — KETOROLAC TROMETHAMINE 30 MG/ML IJ SOLN: 30.0000 mg | Freq: Four times a day (QID) | INTRAMUSCULAR | Status: AC | PRN

## 2012-01-21 MED ORDER — ONDANSETRON HCL 4 MG PO TABS: 4.0000 mg | ORAL_TABLET | ORAL | Status: DC | PRN

## 2012-01-21 MED ORDER — MEPERIDINE HCL 25 MG/ML IJ SOLN: 6.2500 mg | INTRAMUSCULAR | Status: DC | PRN

## 2012-01-21 MED ORDER — PHENYLEPHRINE 40 MCG/ML (10ML) SYRINGE FOR IV PUSH (FOR BLOOD PRESSURE SUPPORT)
PREFILLED_SYRINGE | INTRAVENOUS | Status: AC
  Filled 2012-01-21: qty 5

## 2012-01-21 MED ORDER — TETANUS-DIPHTH-ACELL PERTUSSIS 5-2.5-18.5 LF-MCG/0.5 IM SUSP
0.5000 mL | Freq: Once | INTRAMUSCULAR | Status: DC
  Filled 2012-01-21: qty 0.5

## 2012-01-21 MED ORDER — KETOROLAC TROMETHAMINE 60 MG/2ML IM SOLN
60.0000 mg | Freq: Once | INTRAMUSCULAR | Status: AC | PRN
  Administered 2012-01-21: 60 mg via INTRAMUSCULAR

## 2012-01-21 MED ORDER — IBUPROFEN 600 MG PO TABS: 600.0000 mg | ORAL_TABLET | Freq: Four times a day (QID) | ORAL | Status: DC | PRN

## 2012-01-21 MED ORDER — ONDANSETRON HCL 4 MG/2ML IJ SOLN: 4.0000 mg | Freq: Three times a day (TID) | INTRAMUSCULAR | Status: DC | PRN

## 2012-01-21 MED ORDER — PRENATAL MULTIVITAMIN CH
1.0000 | ORAL_TABLET | Freq: Every day | ORAL | Status: DC
  Filled 2012-01-21 (×4): qty 1

## 2012-01-21 MED ORDER — LABETALOL HCL 300 MG PO TABS
300.0000 mg | ORAL_TABLET | Freq: Two times a day (BID) | ORAL | Status: DC
  Administered 2012-01-21 – 2012-01-23 (×5): 300 mg via ORAL
  Filled 2012-01-21 (×9): qty 1

## 2012-01-21 MED ORDER — FENTANYL CITRATE 0.05 MG/ML IJ SOLN
25.0000 ug | Freq: Once | INTRAMUSCULAR | Status: AC
  Administered 2012-01-21: 25 ug via INTRAVENOUS
  Filled 2012-01-21: qty 2

## 2012-01-21 MED ORDER — KETOROLAC TROMETHAMINE 60 MG/2ML IM SOLN
INTRAMUSCULAR | Status: AC
  Administered 2012-01-21: 60 mg via INTRAMUSCULAR
  Filled 2012-01-21: qty 2

## 2012-01-21 MED ORDER — LANOLIN HYDROUS EX OINT: 1.0000 "application " | TOPICAL_OINTMENT | CUTANEOUS | Status: DC | PRN

## 2012-01-21 MED ORDER — SCOPOLAMINE 1 MG/3DAYS TD PT72
MEDICATED_PATCH | TRANSDERMAL | Status: AC
  Filled 2012-01-21: qty 1

## 2012-01-21 MED ORDER — MEASLES, MUMPS & RUBELLA VAC ~~LOC~~ INJ: 0.5000 mL | INJECTION | Freq: Once | SUBCUTANEOUS | Status: DC

## 2012-01-21 MED ORDER — CEFAZOLIN SODIUM-DEXTROSE 2-3 GM-% IV SOLR
INTRAVENOUS | Status: DC | PRN
  Administered 2012-01-21: 2 g via INTRAVENOUS

## 2012-01-21 MED ORDER — ZOLPIDEM TARTRATE 5 MG PO TABS
5.0000 mg | ORAL_TABLET | Freq: Every evening | ORAL | Status: DC | PRN
  Administered 2012-01-22 – 2012-01-23 (×2): 5 mg via ORAL
  Filled 2012-01-21 (×2): qty 1

## 2012-01-21 MED ORDER — WITCH HAZEL-GLYCERIN EX PADS: 1.0000 "application " | MEDICATED_PAD | CUTANEOUS | Status: DC | PRN

## 2012-01-21 MED ORDER — ONDANSETRON HCL 4 MG/2ML IJ SOLN
INTRAMUSCULAR | Status: DC | PRN
  Administered 2012-01-21: 4 mg via INTRAVENOUS

## 2012-01-21 MED ORDER — MAGNESIUM SULFATE BOLUS VIA INFUSION
4.0000 g | Freq: Once | INTRAVENOUS | Status: AC
  Administered 2012-01-21: 4 g via INTRAVENOUS
  Filled 2012-01-21: qty 500

## 2012-01-21 MED ORDER — DIPHENHYDRAMINE HCL 50 MG/ML IJ SOLN: 12.5000 mg | INTRAMUSCULAR | Status: DC | PRN

## 2012-01-21 MED ORDER — DOCUSATE SODIUM 100 MG PO CAPS: 100.0000 mg | ORAL_CAPSULE | Freq: Every day | ORAL | Status: DC

## 2012-01-21 MED ORDER — SIMETHICONE 80 MG PO CHEW
80.0000 mg | CHEWABLE_TABLET | Freq: Three times a day (TID) | ORAL | Status: DC
  Administered 2012-01-21 – 2012-01-25 (×12): 80 mg via ORAL

## 2012-01-21 MED ORDER — HYDROMORPHONE HCL PF 1 MG/ML IJ SOLN
1.0000 mg | Freq: Once | INTRAMUSCULAR | Status: AC
  Administered 2012-01-21: 1 mg via INTRAVENOUS
  Filled 2012-01-21: qty 1

## 2012-01-21 MED ORDER — IBUPROFEN 800 MG PO TABS
800.0000 mg | ORAL_TABLET | Freq: Three times a day (TID) | ORAL | Status: DC | PRN
  Administered 2012-01-21 – 2012-01-25 (×6): 800 mg via ORAL
  Filled 2012-01-21 (×6): qty 1

## 2012-01-21 MED ORDER — DIBUCAINE 1 % RE OINT: 1.0000 "application " | TOPICAL_OINTMENT | RECTAL | Status: DC | PRN

## 2012-01-21 MED ORDER — HYDROMORPHONE HCL PF 1 MG/ML IJ SOLN
INTRAMUSCULAR | Status: AC
  Administered 2012-01-21: 0.5 mg via INTRAVENOUS
  Filled 2012-01-21: qty 1

## 2012-01-21 MED ORDER — ONDANSETRON HCL 4 MG/2ML IJ SOLN
INTRAMUSCULAR | Status: AC
  Filled 2012-01-21: qty 2

## 2012-01-21 MED ORDER — NALOXONE HCL 0.4 MG/ML IJ SOLN: 0.4000 mg | INTRAMUSCULAR | Status: DC | PRN

## 2012-01-21 MED ORDER — NALOXONE HCL 0.4 MG/ML IJ SOLN: 1.0000 ug/kg/h | INTRAMUSCULAR | Status: DC | PRN

## 2012-01-21 MED ORDER — SENNOSIDES-DOCUSATE SODIUM 8.6-50 MG PO TABS
2.0000 | ORAL_TABLET | Freq: Every day | ORAL | Status: DC
  Administered 2012-01-21 – 2012-01-24 (×4): 2 via ORAL

## 2012-01-21 MED ORDER — MENTHOL 3 MG MT LOZG: 1.0000 | LOZENGE | OROMUCOSAL | Status: DC | PRN

## 2012-01-21 MED ORDER — SODIUM CHLORIDE 0.9 % IJ SOLN
3.0000 mL | Freq: Two times a day (BID) | INTRAMUSCULAR | Status: DC
  Administered 2012-01-22 – 2012-01-23 (×2): 3 mL via INTRAVENOUS

## 2012-01-21 MED ORDER — CITRIC ACID-SODIUM CITRATE 334-500 MG/5ML PO SOLN
ORAL | Status: AC
  Administered 2012-01-21: 30 mL
  Filled 2012-01-21: qty 15

## 2012-01-21 MED ORDER — SODIUM CHLORIDE 0.9 % IV SOLN: 250.0000 mL | INTRAVENOUS | Status: DC

## 2012-01-21 MED ORDER — KETOROLAC TROMETHAMINE 30 MG/ML IJ SOLN
30.0000 mg | Freq: Four times a day (QID) | INTRAMUSCULAR | Status: AC | PRN
  Administered 2012-01-22: 30 mg via INTRAVENOUS
  Filled 2012-01-21: qty 1

## 2012-01-21 MED ORDER — MAGNESIUM SULFATE 40 G IN LACTATED RINGERS - SIMPLE
2.0000 g/h | INTRAVENOUS | Status: AC
  Administered 2012-01-21 – 2012-01-22 (×2): 2 g/h via INTRAVENOUS
  Filled 2012-01-21 (×2): qty 500

## 2012-01-21 MED ORDER — FENTANYL CITRATE 0.05 MG/ML IJ SOLN
INTRAMUSCULAR | Status: DC | PRN
  Administered 2012-01-21: 25 ug via INTRAVENOUS
  Administered 2012-01-21: 50 ug via INTRAVENOUS

## 2012-01-21 MED ORDER — OXYTOCIN 40 UNITS IN LACTATED RINGERS INFUSION - SIMPLE MED
62.5000 mL/h | INTRAVENOUS | Status: AC
  Administered 2012-01-21: 62.5 mL/h via INTRAVENOUS
  Filled 2012-01-21: qty 1000

## 2012-01-21 MED ORDER — BUPIVACAINE HCL (PF) 0.25 % IJ SOLN
INTRAMUSCULAR | Status: AC
  Filled 2012-01-21: qty 30

## 2012-01-21 SURGICAL SUPPLY — 28 items
CLOTH BEACON ORANGE TIMEOUT ST (SAFETY) ×2 IMPLANT
DRAPE SURG 17X23 STRL (DRAPES) ×2 IMPLANT
DRESSING TELFA 8X3 (GAUZE/BANDAGES/DRESSINGS) IMPLANT
DRSG COVADERM 4X10 (GAUZE/BANDAGES/DRESSINGS) ×2 IMPLANT
DURAPREP 26ML APPLICATOR (WOUND CARE) ×2 IMPLANT
ELECT REM PT RETURN 9FT ADLT (ELECTROSURGICAL) ×2
ELECTRODE REM PT RTRN 9FT ADLT (ELECTROSURGICAL) ×1 IMPLANT
EXTRACTOR VACUUM M CUP 4 TUBE (SUCTIONS) IMPLANT
GAUZE SPONGE 4X4 12PLY STRL LF (GAUZE/BANDAGES/DRESSINGS) ×4 IMPLANT
GLOVE BIO SURGEON STRL SZ7 (GLOVE) ×4 IMPLANT
GOWN PREVENTION PLUS LG XLONG (DISPOSABLE) ×6 IMPLANT
KIT ABG SYR 3ML LUER SLIP (SYRINGE) IMPLANT
NEEDLE HYPO 25X5/8 SAFETYGLIDE (NEEDLE) ×2 IMPLANT
NS IRRIG 1000ML POUR BTL (IV SOLUTION) ×2 IMPLANT
PACK C SECTION WH (CUSTOM PROCEDURE TRAY) ×2 IMPLANT
PAD ABD 7.5X8 STRL (GAUZE/BANDAGES/DRESSINGS) IMPLANT
PAD OB MATERNITY 4.3X12.25 (PERSONAL CARE ITEMS) IMPLANT
SLEEVE SCD COMPRESS KNEE MED (MISCELLANEOUS) ×2 IMPLANT
STRIP CLOSURE SKIN 1/2X4 (GAUZE/BANDAGES/DRESSINGS) ×2 IMPLANT
SUT CHROMIC 0 CTX 36 (SUTURE) ×6 IMPLANT
SUT MON AB 4-0 PS1 27 (SUTURE) ×2 IMPLANT
SUT PDS AB 0 CT1 27 (SUTURE) ×4 IMPLANT
SUT PDS AB 0 CTX 60 (SUTURE) ×2 IMPLANT
SUT VIC AB 3-0 CT1 27 (SUTURE) ×2
SUT VIC AB 3-0 CT1 TAPERPNT 27 (SUTURE) ×2 IMPLANT
TOWEL OR 17X24 6PK STRL BLUE (TOWEL DISPOSABLE) ×4 IMPLANT
TRAY FOLEY CATH 14FR (SET/KITS/TRAYS/PACK) ×2 IMPLANT
WATER STERILE IRR 1000ML POUR (IV SOLUTION) IMPLANT

## 2012-01-21 NOTE — Progress Notes (Signed)
Phone conversation with MFM, pt has decr AFI with rev EDW, SGA, BPP 6/8, they rec del>>will plan CS deliv today with PP MgSO4>>proced  And CS discussed w/ pt

## 2012-01-21 NOTE — Anesthesia Procedure Notes (Signed)
Spinal  Patient location during procedure: OR Preanesthetic Checklist Completed: patient identified, site marked, surgical consent, pre-op evaluation, timeout performed, IV checked, risks and benefits discussed and monitors and equipment checked Spinal Block Patient position: sitting Prep: DuraPrep Patient monitoring: heart rate, cardiac monitor, continuous pulse ox and blood pressure Approach: midline Location: L3-4 Injection technique: single-shot Needle Needle type: Sprotte  Needle gauge: 24 G Needle length: 9 cm Assessment Sensory level: T4 Additional Notes Spinal Dosage in OR  Bupivicaine ml       1.9 PFMS04   mcg        150 Fentanyl mcg            25    

## 2012-01-21 NOTE — H&P (Signed)
  Amy Costa  DICTATION # [id number] CSN# [id number]   Meriel Pica, MD 01/21/2012 12:27 PM

## 2012-01-21 NOTE — Progress Notes (Signed)
Ms. Maness was seen for ultrasound appointment today.  Please see [facility]OB[facility] report for details.  

## 2012-01-21 NOTE — Op Note (Signed)
Preoperative diagnosis: 32+ week gestation, SGA, oligohydramnios, reverse end diastolic flow on Doppler study, chronic hypertension, superimposed preeclampsia  Postoperative diagnosis: Same, plus leiomyoma  Procedure: Primary low transverse cesarean section, myomectomy  Surgeon: Marcelle Overlie  Anesthesia: Spinal  EBL: 800 cc  Drains: Foley catheter  Specimens removed: Placenta, to pathology  Procedure and findings:  The patient was taken the operating room after an adequate level of spinal anesthetic was obtained with the patient in left tilt position the abdomen prepped and draped in the usual fashion for cesarean section. Appropriate timeout for taken. She previously received Ancef 2 g IV. Transverse Pfannenstiel's a made 2 finger breaths above the symphysis carried down to the fascia which was incised and extended transversely. Rectus muscle divided in the midline, peritoneum entered superiorly without incident and extended in a vertical fashion. The vesicouterine serosa was incised and the bladder was bluntly and sharply dissected below, bladder blade repositioned. Transverse incision made lower segment extended with bandage scissors. Clear fluid noted although minimal. The patient was then delivered of a healthy female in the vertex presentation cord pH was 7.369, Apgars pending. The infant was suctioned cord clamped and passed the pediatric team for further care. The placenta was then delivered manually intact, uterus exteriorized cavity wiped clean with a laparotomy pack closure transversely of 0 chromic in a locked fashion followed by #layer of 0 chromic this was hemostatic the bladder flap area was intact and hemostatic bilateral tubes and ovaries were normal. There was a solitary serosal leiomyoma approximately 4 cm at the left posterior fundus this was excised and in the lips a deep sutures of 0 chromic followed by interrupted serosal 3-0 Vicryl sutures. This was hemostatic Interceed was placed  across the suture line intact and at each corner. Prior to closure sponge denies precast reported as correct x2 peritoneum was enclose a running 2-0 Vicryl suture. Rectus muscles reapproximated with 3-0 Vicryl suture in the midline a doubly looped 0 PDS suture was then used to close the fascia transversely. Subcutaneous tissue was irrigated noted be hemostatic interrupted 3-0 Vicryl sutures used to reapproximate the subcutaneous dead space. 4-0 Monocryl subcutaneous for suture with a sterile dressing applied. She tolerated this well went to recovery in good condition.  Dictated with [facility]  Amy Costa M. Amy Costa.D.

## 2012-01-21 NOTE — Anesthesia Preprocedure Evaluation (Addendum)
Anesthesia Evaluation  Patient identified by MRN, date of birth, ID band Patient awake    Reviewed: Allergy & Precautions, H&P , Patient's Chart, lab work & pertinent test results  Airway Mallampati: III TM Distance: >3 FB Neck ROM: full    Dental No notable dental hx.    Pulmonary  breath sounds clear to auscultation  Pulmonary exam normal       Cardiovascular Exercise Tolerance: Good hypertension, Pt. on medications and Pt. on home beta blockers Rhythm:regular Rate:Normal     Neuro/Psych    GI/Hepatic   Endo/Other  Morbid obesity  Renal/GU      Musculoskeletal   Abdominal   Peds  Hematology   Anesthesia Other Findings Plts stable   Reproductive/Obstetrics                           Anesthesia Physical Anesthesia Plan  ASA: III and Emergent  Anesthesia Plan: Spinal   Post-op Pain Management:    Induction:   Airway Management Planned:   Additional Equipment:   Intra-op Plan:   Post-operative Plan:   Informed Consent: I have reviewed the patients History and Physical, chart, labs and discussed the procedure including the risks, benefits and alternatives for the proposed anesthesia with the patient or authorized representative who has indicated his/her understanding and acceptance.   Dental Advisory Given  Plan Discussed with: CRNA  Anesthesia Plan Comments: (Lab work confirmed with CRNA in room. Platelets okay. Discussed spinal anesthetic, and patient consents to the procedure:  included risk of possible headache,backache, failed block, allergic reaction, and nerve injury. This patient was asked if she had any questions or concerns before the procedure started. )        Anesthesia Quick Evaluation

## 2012-01-21 NOTE — Anesthesia Postprocedure Evaluation (Signed)
  Anesthesia Post-op Note  Patient: Amy Costa  Procedure(s) Performed: Procedure(s) (LRB) with comments: CESAREAN SECTION (N/A) MYOMECTOMY (N/A)  Patient is awake, responsive, moving her legs, and has signs of resolution of her numbness. Pain and nausea are reasonably well controlled. Vital signs are stable and clinically acceptable. Oxygen saturation is clinically acceptable. There are no apparent anesthetic complications at this time. Patient is ready for discharge.

## 2012-01-21 NOTE — Transfer of Care (Signed)
Immediate Anesthesia Transfer of Care Note  Patient: Amy Costa  Procedure(s) Performed: Procedure(s) (LRB) with comments: CESAREAN SECTION (N/A) MYOMECTOMY (N/A)  Patient Location: PACU  Anesthesia Type: Spinal  Level of Consciousness: awake, alert  and oriented  Airway & Oxygen Therapy: Patient Spontanous Breathing  Post-op Assessment: Report given to PACU RN and Post -op Vital signs reviewed and stable  Post vital signs: Reviewed and stable  Complications: No apparent anesthesia complications

## 2012-01-21 NOTE — Progress Notes (Signed)
Pt. Admitted to unit from PACU with PIH on Mag Sulfate. VSS. Report given by PACU RN. Patient alert, comfortable. Will continue to monitor.

## 2012-01-22 ENCOUNTER — Encounter (HOSPITAL_COMMUNITY): Payer: Self-pay | Admitting: Obstetrics and Gynecology

## 2012-01-22 LAB — COMPREHENSIVE METABOLIC PANEL
ALT: 14 U/L (ref 0–35)
AST: 25 U/L (ref 0–37)
Albumin: 2.2 g/dL — ABNORMAL LOW (ref 3.5–5.2)
Alkaline Phosphatase: 82 U/L (ref 39–117)
BUN: 8 mg/dL (ref 6–23)
CO2: 24 mEq/L (ref 19–32)
Calcium: 7.7 mg/dL — ABNORMAL LOW (ref 8.4–10.5)
Chloride: 103 mEq/L (ref 96–112)
Creatinine, Ser: 0.61 mg/dL (ref 0.50–1.10)
GFR calc Af Amer: >90 mL/min (ref >90)
GFR calc non Af Amer: >90 mL/min (ref >90)
Glucose, Bld: 85 mg/dL (ref 70–99)
Potassium: 3.5 mEq/L (ref 3.5–5.1)
Sodium: 135 mEq/L (ref 135–145)
Total Bilirubin: 0.3 mg/dL (ref 0.3–1.2)
Total Protein: 5 g/dL — ABNORMAL LOW (ref 6.0–8.3)

## 2012-01-22 LAB — RPR: RPR Ser Ql: NONREACTIVE

## 2012-01-22 LAB — CBC
HCT: 31.5 % — ABNORMAL LOW (ref 36.0–46.0)
Hemoglobin: 10.2 g/dL — ABNORMAL LOW (ref 12.0–15.0)
MCH: 26.2 pg (ref 26.0–34.0)
MCHC: 32.4 g/dL (ref 30.0–36.0)
MCV: 80.8 fL (ref 78.0–100.0)
Platelets: 152 10*3/uL (ref 150–400)
RBC: 3.9 MIL/uL (ref 3.87–5.11)
RDW: 14.5 % (ref 11.5–15.5)
WBC: 9.5 10*3/uL (ref 4.0–10.5)

## 2012-01-22 MED ORDER — FUROSEMIDE 10 MG/ML IJ SOLN
10.0000 mg | Freq: Once | INTRAMUSCULAR | Status: AC
  Administered 2012-01-22: 10 mg via INTRAVENOUS
  Filled 2012-01-22: qty 2

## 2012-01-22 NOTE — Anesthesia Postprocedure Evaluation (Signed)
  Anesthesia Post-op Note  Patient: Amy Costa  Procedure(s) Performed: Procedure(s) (LRB) with comments: CESAREAN SECTION (N/A) MYOMECTOMY (N/A)  Patient Location: A-ICU  Anesthesia Type: Spinal  Level of Consciousness: awake and alert   Airway and Oxygen Therapy: Patient Spontanous Breathing  Post-op Pain: none  Post-op Assessment: Patient's Cardiovascular Status Stable, Respiratory Function Stable, Patent Airway, No signs of Nausea or vomiting, Adequate PO intake, Pain level controlled, No headache, No backache, No residual numbness and No residual motor weakness  Post-op Vital Signs: Reviewed and stable  Complications: No apparent anesthesia complications

## 2012-01-22 NOTE — Progress Notes (Signed)
Report to Dr. Langston Masker re inc BP, inc DTR's, clonus bilaterally.  Discussed BP meds - will continue with Labetalol as ordered.  May transfer to floor as prev ordered.

## 2012-01-22 NOTE — Addendum Note (Signed)
Addendum  created 01/22/12 0953 by Suella Grove, CRNA   Modules edited:Notes Section

## 2012-01-22 NOTE — H&P (Signed)
[name], [name] NO.:  [id number]  MEDICAL RECORD NO.:  [id number]  LOCATION:                                 FACILITY:  PHYSICIAN:  Amy Costa. Amy Costa, M.D.DATE OF BIRTH:  [birth year]  DATE OF ADMISSION:  01/21/2012 DATE OF DISCHARGE:                             HISTORY & PHYSICAL   CHIEF COMPLAINT:  A 32 and 2/7th week, chronic hypertension with preeclampsia.  HISTORY OF PRESENT ILLNESS:  A 31 year old, G1, P0, EDD November 18, currently 32 and 2/7th weeks.  The patient has a history of chronic hypertension at start of the pregnancy on labetalol 200 mg p.o. b.i.d. Pressures have been fairly normal until July 15 when they started to increase somewhat due to the incomplete ultrasound.  She had MFM consult at that point that showed possible peripheral insertion of the umbilical cord.  Followup ultrasound mid September by MFM showed abdominal circumference at the third percentile with a slight elevation of the ST ratio on Doppler.  BPP 8/8.  She did receive outpatient betamethasone at that point with nonstress test twice weekly.  Presents to the office today complaining of headache, BP 148/110, 3+ protein, and increased swelling.  Will be admitted for further evaluation and followup ultrasound with Doppler studies.  One hour GTT was 122.  Blood type is O positive.  PAST MEDICAL HISTORY:  Allergies:  None.  Please see the [facility] form for details.  PHYSICAL EXAMINATION:  VITAL SIGNS:  Temp 98.2, blood pressure 148/110. HEENT:  Unremarkable. NECK:  Supple without masses. LUNGS:  Clear. CARDIOVASCULAR:  Regular rate and rhythm without murmurs, rubs, gallops. BREASTS:  Not examined. PELVIC:  A 32 cm fundal height.  Fetal heart rate 148.  Cervix was not examined. EXTREMITIES:  Revealed 2+ lower extremity pitting edema. NEUROLOGIC:  Reflexes 2+.  No clonus.  IMPRESSION: 1. History of chronic hypertension. 2. A 32 and 2/7th weeks gestation. 3.  Superimposed preeclampsia.  PLAN:  We will admit for lab studies, follow up ultrasound today and monitoring.     Amy Costa M. Amy Costa, M.D.     RM[facility]/MEDQ  D:  01/21/2012  T:  01/21/2012  Job:  [id number]

## 2012-01-22 NOTE — Progress Notes (Signed)
UR chart review completed.  

## 2012-01-22 NOTE — Progress Notes (Signed)
Subjective: Postpartum Day 1: Cesarean Delivery Patient reports tolerating PO and + flatus.  Foley in 2/2 MagSO4.  No HA, CP/SOB, RUQ pain, vision change.    Objective: Vital signs in last 24 hours: Temp:  [97.6 F (36.4 C)-98.3 F (36.8 C)] 98.2 F (36.8 C) (09/26 0800) Pulse Rate:  [52-78] 57  (09/26 0800) Resp:  [16-22] 18  (09/26 0800) BP: (118-191)/(60-104) 141/85 mmHg (09/26 0800) SpO2:  [93 %-100 %] 97 % (09/26 0800) Weight:  [131.543 kg (290 lb)-131.6 kg (290 lb 2 oz)] 131.543 kg (290 lb) (09/25 1830)  Physical Exam:  General: alert, cooperative and appears stated age Lochia: appropriate Uterine Fundus: firm Incision: no significant drainage, dressing in place with outlined area of drainage stable from PACU. DVT Evaluation: No evidence of DVT seen on physical exam. Negative Homan's sign. No cords or calf tenderness. 2+DTRs, no clonus.   Basename 01/22/12 0555 01/21/12 1445  HGB 10.2* 11.9*  HCT 31.5* 36.5    Assessment/Plan: Status post Cesarean section. Doing well postoperatively.  Continue current care. CHTN with SIPE: Continue postpartum MagSO4 x 24 hours.  Labs stable.  Transfer to floor after Mag is discontinued.    Oneka Parada 01/22/2012, 8:23 AM

## 2012-01-22 NOTE — Progress Notes (Signed)
Visitors constantly in & out ? Spoke with pt. And s.o. About limiting visitors for now d/t BP

## 2012-01-23 MED ORDER — OXYCODONE-ACETAMINOPHEN 5-325 MG PO TABS
1.0000 | ORAL_TABLET | ORAL | Status: DC | PRN
  Administered 2012-01-23: 1 via ORAL
  Administered 2012-01-24 (×3): 2 via ORAL
  Administered 2012-01-25: 1 via ORAL
  Administered 2012-01-25: 2 via ORAL
  Filled 2012-01-23 (×4): qty 2
  Filled 2012-01-23: qty 1

## 2012-01-23 NOTE — Progress Notes (Signed)
Subjective: Postpartum Day 2: Cesarean Delivery Patient reports tolerating PO, + flatus and no problems voiding.  No HA, visual changes  Objective: Vital signs in last 24 hours: Temp:  [97.2 F (36.2 C)-99.2 F (37.3 C)] 99.2 F (37.3 C) (09/27 0830) Pulse Rate:  [29-90] 79  (09/27 0830) Resp:  [16-22] 18  (09/27 0830) BP: (108-174)/(60-99) 160/94 mmHg (09/27 0830) SpO2:  [95 %-100 %] 99 % (09/27 0830) Weight:  [131.108 kg (289 lb 0.6 oz)] 131.108 kg (289 lb 0.6 oz) (09/27 0839)  Physical Exam:  General: alert and cooperative Lochia: appropriate Uterine Fundus: firm Incision: healing well DVT Evaluation: No evidence of DVT seen on physical exam.   Basename 01/22/12 0555 01/21/12 1445  HGB 10.2* 11.9*  HCT 31.5* 36.5    Assessment/Plan: Status post Cesarean section. Doing well postoperatively.  Continue current care.  Amy Costa 01/23/2012, 9:12 AM

## 2012-01-23 NOTE — Progress Notes (Signed)
0840 Report given to [employer] on WU.

## 2012-01-23 NOTE — Clinical Social Work Maternal (Signed)
    Clinical Social Work Department PSYCHOSOCIAL ASSESSMENT - MATERNAL/CHILD 01/23/2012  Patient:  Amy Costa, Amy Costa  Account Number:  [id number]  Admit Date:  01/21/2012  Marjo Bicker Name:   Amy Costa    Clinical Social Worker:  Lulu Riding, LCSW   Date/Time:  01/23/2012 03:30 PM  Date Referred:  01/23/2012   Referral source  NICU     Referred reason  NICU   Other referral source:    I:  FAMILY / HOME ENVIRONMENT Child's legal guardian:  PARENT  Guardian - Name Guardian - Age Guardian - Address  Rilie Glanz [address] [place], [place] [address]  Kimyatta Lecy  same   Other household support members/support persons Other support:   Good support from family on both sides who all live in the area.    II  PSYCHOSOCIAL DATA Information Source:  Family Interview  Surveyor, quantity and [employer] Employment:   FOB works for a [employer] fast streamer  MOB works for [employer] resources:  [employer] If [employer]:    School / Grade:   Maternity Care Coordinator / Child Services Coordination / Early Interventions:  Cultural issues impacting care:   None identified    III  STRENGTHS Strengths  Adequate Resources  Compliance with medical plan  Other - See comment  Supportive family/friends  Understanding of illness   Strength comment:  SW gave pediatrician list.   IV  RISK FACTORS AND CURRENT PROBLEMS Current Problem:  None     V  SOCIAL WORK ASSESSMENT SW met with parents in MOB's third floor room/310 to introduce myself, complete assessment and evaluate how they are coping with baby's premature birth and admission to NICU.  Both parents were pleasant and contributed to the conversation.  They report doing well and seem to be coping well with the situation.  They appear to have a good understanding of the situation.  SW discussed what to expect from a typical premature admission and how to frame their thinking to  hopefully reduce being letdown along the way.  They stated understanding and acknowleges the potential stress of the situation.  SW discussed signs and symptoms of PPD and ensured that they feel comfortable talking with someone if symptoms arise.  They agreed.  MOB states feeling fine emotionally at this time.  They have some supplies for the baby at this time and think they have the means to get everything before baby goes home.  SW asked them to let SW know if they are having difficulty and they agreed.  They report no issues with transportation and a good support system.  SW explained support services offered by NICU SW and gave contact information.  Parents seemed appreciative of SW's visit and state no further questions or needs at this time.      VI SOCIAL WORK PLAN Social Work Plan  Psychosocial Support/Ongoing Assessment of Needs   Type of pt/family education:   PPD  What to expect from a NICU experience   If child protective services report - county:   If child protective services report - date:   Information/referral to community resources comment:   Feelings After [employer]   Other social work plan:

## 2012-01-24 LAB — CULTURE, BETA STREP (GROUP B ONLY)

## 2012-01-24 MED ORDER — LABETALOL HCL 200 MG PO TABS
200.0000 mg | ORAL_TABLET | Freq: Three times a day (TID) | ORAL | Status: DC
  Administered 2012-01-24 – 2012-01-25 (×4): 200 mg via ORAL
  Filled 2012-01-24 (×4): qty 1

## 2012-01-24 NOTE — Progress Notes (Signed)
Subjective: Postpartum Day : Cesarean Delivery Patient reports tolerating PO, + flatus and no problems voiding.  Still having elevated BP in the evening.  No HA, n/v or RUQ pain  Objective: Vital signs in last 24 hours: Temp:  [97.3 F (36.3 C)-98.4 F (36.9 C)] 97.3 F (36.3 C) (09/28 0543) Pulse Rate:  [70-84] 80  (09/28 0543) Resp:  [18] 18  (09/28 0543) BP: (127-167)/(80-88) 137/80 mmHg (09/28 0543) SpO2:  [96 %-99 %] 98 % (09/28 0543)  Physical Exam:  General: alert and cooperative Lochia: appropriate Uterine Fundus: firm Incision: healing well, no significant drainage DVT Evaluation: No evidence of DVT seen on physical exam.   Basename 01/22/12 0555 01/21/12 1445  HGB 10.2* 11.9*  HCT 31.5* 36.5    Assessment/Plan: Status post Cesarean section. Doing well postoperatively.  Change labetalol to 200mg  tid. Plan d/c in am if BP ok  Amy Costa 01/24/2012, 9:04 AM

## 2012-01-25 MED ORDER — LABETALOL HCL 200 MG PO TABS: 200.0000 mg | ORAL_TABLET | Freq: Three times a day (TID) | ORAL | Status: DC

## 2012-01-25 MED ORDER — PRENATAL MULTIVITAMIN CH: 1.0000 | ORAL_TABLET | Freq: Every day | ORAL | Status: DC

## 2012-01-25 MED ORDER — IBUPROFEN 800 MG PO TABS: 800.0000 mg | ORAL_TABLET | Freq: Three times a day (TID) | ORAL | Status: DC | PRN

## 2012-01-25 MED ORDER — OXYCODONE-ACETAMINOPHEN 5-325 MG PO TABS: 1.0000 | ORAL_TABLET | ORAL | Status: DC | PRN

## 2012-01-25 NOTE — Progress Notes (Signed)
Discharge instructions reviewed with patient.  Patient able to "Teach Back" home care and signs/symptoms to report to MD.  Ambulated to NICU to visit baby and will discharge home from there.  Discharged home with family.

## 2012-01-25 NOTE — Discharge Summary (Signed)
Obstetric Discharge Summary Reason for Admission: observation/evaluation Prenatal Procedures: Preeclampsia and ultrasound Intrapartum Procedures: cesarean: low cervical, transverse Postpartum Procedures: magnesium Complications-Operative and Postpartum: none Hemoglobin  Date Value Range Status  01/22/2012 10.2* 12.0 - 15.0 g/dL Final     HCT  Date Value Range Status  01/22/2012 31.5* 36.0 - 46.0 % Final    Physical Exam:  General: alert and cooperative Lochia: appropriate Uterine Fundus: firm Incision: healing well, no significant drainage DVT Evaluation: No evidence of DVT seen on physical exam.  Discharge Diagnoses: Term Pregnancy-delivered  Discharge Information: Date: 01/25/2012 Activity: pelvic rest Diet: routine Medications: PNV, Ibuprofen, Percocet and labetalol Condition: stable Instructions: refer to practice specific booklet Discharge to: home Follow-up Information    Schedule an appointment as soon as possible for a visit in 1 week to follow up.         Newborn Data: Live born female  Birth Weight: 2 lb 13.5 oz (1290 g) APGAR: 7, 8  Home with mother.  Amy Costa 01/25/2012, 9:29 AM

## 2012-05-21 ENCOUNTER — Ambulatory Visit (INDEPENDENT_AMBULATORY_CARE_PROVIDER_SITE_OTHER): Payer: BC Managed Care – PPO | Admitting: Family Medicine

## 2012-05-21 ENCOUNTER — Encounter: Payer: Self-pay | Admitting: Family Medicine

## 2012-05-21 ENCOUNTER — Encounter: Payer: Self-pay | Admitting: *Deleted

## 2012-05-21 VITALS — BP 128/84 | HR 76 | Temp 98.4°F | Wt 278.2 lb

## 2012-05-21 DIAGNOSIS — G43909 Migraine, unspecified, not intractable, without status migrainosus: Secondary | ICD-10-CM

## 2012-05-21 MED ORDER — TOPIRAMATE 50 MG PO TABS: 50.0000 mg | ORAL_TABLET | Freq: Every day | ORAL | Status: DC

## 2012-05-21 MED ORDER — CYCLOBENZAPRINE HCL 10 MG PO TABS: 10.0000 mg | ORAL_TABLET | Freq: Three times a day (TID) | ORAL | Status: DC | PRN

## 2012-05-21 MED ORDER — PROMETHAZINE HCL 25 MG/ML IJ SOLN
25.0000 mg | Freq: Once | INTRAMUSCULAR | Status: AC
  Administered 2012-05-21: 25 mg via INTRAMUSCULAR

## 2012-05-21 MED ORDER — KETOROLAC TROMETHAMINE 30 MG/ML IJ SOLN
30.0000 mg | Freq: Once | INTRAMUSCULAR | Status: AC
  Administered 2012-05-21: 30 mg via INTRAMUSCULAR

## 2012-05-21 NOTE — Addendum Note (Signed)
Addended by: Josph Macho A on: 05/21/2012 03:17 PM   Modules accepted: Orders

## 2012-05-21 NOTE — Progress Notes (Signed)
  Subjective:    Patient ID: Amy Costa, female    DOB: 31-Mar-1981, 32 y.o.   MRN: [id number]  HPI CC: migraine  Now with 3 d h/o migraine - feels like previous migraines.  Starts in back of neck and travels to left side of head.  Described as throbbing pain.  + nausea.  Can affect periorbital area with pain.  No photo or phonophobia.  Feels like prior migraines.  Headaches started returning 1-2 mo ago, relates with return to work.  Sitting on computer all day.  Denies vision changes.  Using tylenol/ibuprofen for headaches, doesn't really help.  Takes analgesic every other day.  Currently present at 4/10 pain, at its worse 7-8/10   Minimal nausea.  H/o migraines.  Prior saw migraine doctor (at [facility]).  Has had epidural injections, cortisone shots.  Has 4 mo child at home - in nicu for 4 wks.  8 wks early 2/2 pre eclampsia.  Child doing well at home.  Prior on topamax but stopped during pregnancy.    Birth control - IUD placed 02/2012.  Past Medical History  Diagnosis Date  . DDD (degenerative disc disease), cervical   . Migraine     Dr. Yves Dill at [facility]  . Insomnia, unspecified   . HPV (human papilloma virus) infection   . Depression     on zoloft for years  . Hypertension     Review of Systems Per HPI    Objective:   Physical Exam  Nursing note and vitals reviewed. Constitutional: She appears well-developed and well-nourished. No distress.  HENT:  Mouth/Throat: Oropharynx is clear and moist. No oropharyngeal exudate.  Eyes: Conjunctivae normal and EOM are normal. Pupils are equal, round, and reactive to light.  Neck: Normal range of motion. Neck supple.  Cardiovascular: Normal rate, regular rhythm, normal heart sounds and intact distal pulses.   No murmur heard. Pulmonary/Chest: Effort normal and breath sounds normal. No respiratory distress. She has no wheezes. She has no rales.  Lymphadenopathy:    She has no cervical adenopathy.  Neurological:  She has normal strength. No cranial nerve deficit or sensory deficit.       CN 2-12 intact       Assessment & Plan:

## 2012-05-21 NOTE — Patient Instructions (Signed)
Shot of toradol and phenergan today. Start topanax 25mg  daily (1/2 pill) for 1 week then increase to 50mg  daily. May use flexeril 10mg  as needed to stop migraine - take at onset of HA.  Watch out it can make you sleepy.

## 2012-05-21 NOTE — Assessment & Plan Note (Signed)
Consistent with prior migraine headaches. Treat today with phenergan/toradol IM Sent home with flexeril and topamax as pt interested in ppx medication given recent increased frequency of migraine headaches. Pt aware of topamax - prior on 100mg  daily. Discussed flexeril sedation precautions.

## 2012-07-14 ENCOUNTER — Ambulatory Visit (INDEPENDENT_AMBULATORY_CARE_PROVIDER_SITE_OTHER): Payer: BC Managed Care – PPO | Admitting: Family Medicine

## 2012-07-14 ENCOUNTER — Encounter: Payer: Self-pay | Admitting: Family Medicine

## 2012-07-14 VITALS — BP 130/76 | HR 76 | Temp 99.0°F | Ht 70.0 in | Wt 277.2 lb

## 2012-07-14 DIAGNOSIS — G43909 Migraine, unspecified, not intractable, without status migrainosus: Secondary | ICD-10-CM

## 2012-07-14 MED ORDER — CYCLOBENZAPRINE HCL 10 MG PO TABS: 10.0000 mg | ORAL_TABLET | Freq: Three times a day (TID) | ORAL | Status: DC | PRN

## 2012-07-14 MED ORDER — GABAPENTIN 300 MG PO CAPS: 300.0000 mg | ORAL_CAPSULE | Freq: Every day | ORAL | Status: DC

## 2012-07-14 MED ORDER — PROMETHAZINE HCL 25 MG/ML IJ SOLN
25.0000 mg | Freq: Once | INTRAMUSCULAR | Status: DC
  Administered 2012-07-14: 25 mg via INTRAMUSCULAR

## 2012-07-14 MED ORDER — KETOROLAC TROMETHAMINE 30 MG/ML IJ SOLN
30.0000 mg | Freq: Once | INTRAMUSCULAR | Status: DC
  Administered 2012-07-14: 30 mg via INTRAMUSCULAR

## 2012-07-14 NOTE — Addendum Note (Signed)
Addended by: Patience Musca on: 07/14/2012 02:19 PM   Modules accepted: Orders

## 2012-07-14 NOTE — Assessment & Plan Note (Signed)
Consistent with prior migraines. Did respond well to phenergan/toradol shot last visit - repeated today. Continue topamax 50mg  daily as well as restart gabapentin for cervicalgia 300mg  nightly. Flexeril to use at home PRN - aware of sedation precautions on these meds.

## 2012-07-14 NOTE — Patient Instructions (Signed)
Restart gabapentin 300mg  at night time for migraine prevention. Continue topamax 50mg  nightly. I've sent in refills of flexeril for migraines and neck tightness. Shot of toradol and phenergan today. Drop off FMLA paperwork. Let me know if migarines aren't better with above.

## 2012-07-14 NOTE — Progress Notes (Signed)
  Subjective:    Patient ID: Amy Costa, female    DOB: 16-Aug-1980, 32 y.o.   MRN: [id number]  HPI CC: migraine  1d h/o migraine that started left lower back and back of head, left side of face.  Feels like previous migraines - which usually start from neck muscle tightness/spasm.  Significant pain at L shoulder.  Trouble sleeping last night.  Nausea yesterday.  No vision changes. No phono/photophobia.  usually doesn't get auras with migraines.  Known cervical disc disease. Known migraines.  Prior saw migraine doctor (at [facility]). Has had epidural injections, cortisone shots.  On topamax 50mg  daily started again 04/2012, have improved.  Has had 2-3 migraines in last month.  Prior to recent pregnancy was on neurontin as well.  Would like to restart this.  So far has tried methocarbanol and ibuprofen, tylenol, vicodin.  Has been treated with toradol /phenergan in past.  Someone drove her today.  Not breast feeding.  Requests FMLA paperwork for headaches.  Past Medical History  Diagnosis Date  . DDD (degenerative disc disease), cervical   . Migraine     Dr. Yves Dill at [facility]  . Insomnia, unspecified   . HPV (human papilloma virus) infection   . Depression     on zoloft for years  . Hypertension   . Pre-eclampsia      Review of Systems Per HPI    Objective:   Physical Exam  Nursing note and vitals reviewed. Constitutional: She appears well-developed and well-nourished. No distress.  HENT:  Mouth/Throat: Oropharynx is clear and moist. No oropharyngeal exudate.  Eyes: Conjunctivae and EOM are normal. Pupils are equal, round, and reactive to light. No scleral icterus.  Cardiovascular: Normal rate, regular rhythm, normal heart sounds and intact distal pulses.   No murmur heard. Pulmonary/Chest: Effort normal and breath sounds normal. No respiratory distress. She has no wheezes. She has no rales.  Musculoskeletal:  No midline cervical tenderness. L trap  tightness and tenderness to palpation. FROM atn eck  Psychiatric: She has a normal mood and affect.       Assessment & Plan:

## 2012-07-20 ENCOUNTER — Telehealth: Payer: Self-pay | Admitting: Family Medicine

## 2012-07-20 NOTE — Telephone Encounter (Signed)
Amy Costa dropped off a fmla form to be filled out.

## 2012-07-25 NOTE — Telephone Encounter (Signed)
Filled and placed in Kim's box. 

## 2012-07-27 NOTE — Telephone Encounter (Signed)
Pt notified forms being mailed 07/27/2012; forms mailed and copy sent to scan.

## 2012-09-03 ENCOUNTER — Ambulatory Visit (INDEPENDENT_AMBULATORY_CARE_PROVIDER_SITE_OTHER): Payer: BC Managed Care – PPO | Admitting: Family Medicine

## 2012-09-03 ENCOUNTER — Encounter: Payer: Self-pay | Admitting: Family Medicine

## 2012-09-03 VITALS — BP 144/88 | HR 100 | Temp 98.1°F | Wt 280.5 lb

## 2012-09-03 DIAGNOSIS — G43909 Migraine, unspecified, not intractable, without status migrainosus: Secondary | ICD-10-CM

## 2012-09-03 DIAGNOSIS — M542 Cervicalgia: Secondary | ICD-10-CM

## 2012-09-03 MED ORDER — GABAPENTIN 300 MG PO CAPS: 300.0000 mg | ORAL_CAPSULE | Freq: Two times a day (BID) | ORAL | Status: DC

## 2012-09-03 MED ORDER — TRAMADOL HCL 50 MG PO TABS: 50.0000 mg | ORAL_TABLET | Freq: Three times a day (TID) | ORAL | Status: DC | PRN

## 2012-09-03 MED ORDER — TOPIRAMATE 100 MG PO TABS: 100.0000 mg | ORAL_TABLET | Freq: Every day | ORAL | Status: DC

## 2012-09-03 NOTE — Assessment & Plan Note (Signed)
Known bulging disc per pt, I have requested records of latest MRI (around 2010). Pt responded well to steroid shots in past. Will refer to ortho for further eval, discussion on treatment options. Increase gabapentin to 300mg  bid, discussed may increase to 300mg  tid after 1-2 wks on higher dose.

## 2012-09-03 NOTE — Patient Instructions (Addendum)
Sign release form for records from [facility] MRI done around 2010. Let's increase topamax to 100mg  nightly. Let's increase gabapentin to 300mg  twice daily. Pass by Marion's office for referral to orhto doctor in [place] to discuss steroid shots into neck. Try tramadol 1-2 pills at a time for breakthrough headache pain.

## 2012-09-03 NOTE — Assessment & Plan Note (Signed)
Poor control as evidenced by persistent daily headaches. Will increase topamax to 100mg  daily - pt was on 150mg  daily prior to pregnancy. Flexeril abortively often doesn't control migraine. Treat headache pain abortively with tramadol, if this doesn't help then consider vicodin vs fioricet. Pt agrees with plan.

## 2012-09-03 NOTE — Progress Notes (Signed)
  Subjective:    Patient ID: Amy Costa, female    DOB: December 02, 1980, 32 y.o.   MRN: [id number]  HPI CC: migraines  See prior note for details.  Migraines - continues with daily HA. Some day worse than others.  Stem from cervical region.  Known cervicalgia and migraines, prior seen by Dr. Yves Dill at [facility]. Requests hydrocodone for pain - had this in past and helped. Triptans haven't helped in past.  Trouble carrying child 2/2 neck pain.  H/o bulging discs on MRI around 2010 in C5/6 s/p ESI which helped.  Would like referral back.  Saw [facility].  Prefers to go to someone in [place].    Takes effexor 75mg  daily for mood - stable.  Past Medical History  Diagnosis Date  . DDD (degenerative disc disease), cervical   . Migraine     Dr. Yves Dill at [facility]  . Insomnia, unspecified   . HPV (human papilloma virus) infection   . Depression     on zoloft for years  . Hypertension   . Pre-eclampsia     Past Surgical History  Procedure Laterality Date  . Arm wound repair / closure    . Back surgery  07/2011 (last injection)    pt reports several cervical spine injections for degenerative disc  . Cesarean section  01/21/2012    Procedure: CESAREAN SECTION;  Surgeon: Meriel Pica, MD;  Location: [facility];  Service: Obstetrics;  Laterality: N/A;  . Myomectomy  01/21/2012    Procedure: MYOMECTOMY;  Surgeon: Meriel Pica, MD;  Location: [facility];  Service: Obstetrics;  Laterality: N/A;   Review of Systems Per HPI    Objective:   Physical Exam  Nursing note and vitals reviewed. Constitutional: She appears well-developed and well-nourished. No distress.  Psychiatric: She has a normal mood and affect.       Assessment & Plan:

## 2012-09-08 ENCOUNTER — Encounter: Payer: Self-pay | Admitting: Family Medicine

## 2013-06-24 ENCOUNTER — Ambulatory Visit (INDEPENDENT_AMBULATORY_CARE_PROVIDER_SITE_OTHER): Payer: BC Managed Care – PPO | Admitting: Internal Medicine

## 2013-06-24 ENCOUNTER — Encounter: Payer: Self-pay | Admitting: Internal Medicine

## 2013-06-24 VITALS — BP 128/90 | HR 80 | Temp 98.2°F | Wt 276.2 lb

## 2013-06-24 DIAGNOSIS — M545 Low back pain, unspecified: Secondary | ICD-10-CM

## 2013-06-24 MED ORDER — HYDROCODONE-ACETAMINOPHEN 5-325 MG PO TABS: 1.0000 | ORAL_TABLET | Freq: Four times a day (QID) | ORAL | Status: DC | PRN

## 2013-06-24 MED ORDER — METHOCARBAMOL 500 MG PO TABS: 500.0000 mg | ORAL_TABLET | Freq: Two times a day (BID) | ORAL | Status: DC | PRN

## 2013-06-24 MED ORDER — IBUPROFEN 800 MG PO TABS: 800.0000 mg | ORAL_TABLET | Freq: Three times a day (TID) | ORAL | Status: DC | PRN

## 2013-06-24 NOTE — Progress Notes (Signed)
Subjective:    Patient ID: Amy Costa, female    DOB: 1981/01/01, 33 y.o.   MRN: [id number]  HPI  Pt presents to the clinic today with c/o back pain. This started 5 days ago. She felt like she may have strained her lower back while she was picking something up. It did start to get better but yesterday when she was out playing in the snow, she heard something pop in her right lower back. The pain does radiate down her right leg. She did take some Aleve OTC which did help with her symptoms. She denies loss of bowel or bladder.  Review of Systems      Past Medical History  Diagnosis Date  . DDD (degenerative disc disease), cervical 06/2010    C5/6, C6/7 disc protrusions with deformity of thecal sac and cervical cord  . Migraine     Dr. Sharlet Salina at [facility]  . Insomnia, unspecified   . HPV (human papilloma virus) infection   . Depression     on zoloft for years  . Hypertension   . Pre-eclampsia     Current Outpatient Prescriptions  Medication Sig Dispense Refill  . acetaminophen (TYLENOL) 500 MG tablet Take 1,000 mg by mouth every 6 (six) hours as needed. pain      . cyclobenzaprine (FLEXERIL) 10 MG tablet Take 1 tablet (10 mg total) by mouth 3 (three) times daily as needed (headache (sedation precautions)).  30 tablet  3  . gabapentin (NEURONTIN) 300 MG capsule Take 1 capsule (300 mg total) by mouth 2 (two) times daily.  60 capsule  11  . ibuprofen (ADVIL,MOTRIN) 800 MG tablet Take 1 tablet (800 mg total) by mouth every 8 (eight) hours as needed.  30 tablet  0  . levonorgestrel (MIRENA) 20 MCG/24HR IUD 1 each by Intrauterine route once.      . topiramate (TOPAMAX) 100 MG tablet Take 1 tablet (100 mg total) by mouth daily.  30 tablet  11  . traMADol (ULTRAM) 50 MG tablet Take 1-2 tablets (50-100 mg total) by mouth every 8 (eight) hours as needed for pain (headache).  40 tablet  0  . venlafaxine XR (EFFEXOR-XR) 75 MG 24 hr capsule Take 75 mg by mouth daily.      .  [DISCONTINUED] atenolol (TENORMIN) 100 MG tablet Take 1 tablet (100 mg total) by mouth daily.  90 tablet  3  . [DISCONTINUED] norethindrone-ethinyl estradiol-iron (LOESTRIN FE 1.5/30) 1.5-30 MG-MCG tablet Take 1 tablet by mouth daily.         No current facility-administered medications for this visit.    No Known Allergies  Family History  Problem Relation Age of Onset  . COPD Father     + smoker  . Heart failure Father     CHF  . Hypertension Father   . Stroke Father   . Seizures Father   . Lung cancer Father     + smoker  . Healthy Mother   . Healthy Sister   . Healthy Sister   . Diabetes Paternal Grandfather   . Heart attack Maternal Grandmother   . Heart attack Maternal Grandfather   . Alcohol abuse Paternal Grandfather   . Alcohol abuse Maternal Grandfather   . Depression Father   . Depression Mother   . Depression Maternal Grandmother   . Lung cancer Paternal Grandmother     + smoker    History   Social History  . Marital Status: Single    Spouse  Name: N/A    Number of Children: 0  . Years of Education: N/A   Occupational History  . Customer Service Other    [place] History Main Topics  . Smoking status: Never Smoker   . Smokeless tobacco: Never Used  . Alcohol Use: No     Comment: Occasional  . Drug Use: No  . Sexual Activity: Yes    Birth Control/ Protection: None   Other Topics Concern  . Not on file   Social History Narrative  . No narrative on file     Constitutional: Denies fever, malaise, fatigue, headache or abrupt weight changes.  Musculoskeletal: Pt reports back pain. Denies decrease in range of motion, difficulty with gait, or joint pain and swelling.   Neurological: Pt reports radiating pain down her right leg. Denies dizziness, difficulty with memory, difficulty with speech or problems with balance and coordination.   No other specific complaints in a complete review of systems (except as listed in HPI  above).  Objective:   Physical Exam   BP 128/90  Pulse 80  Temp(Src) 98.2 F (36.8 C) (Oral)  Wt 276 lb 4 oz (125.306 kg)  SpO2 98% Wt Readings from Last 3 Encounters:  06/24/13 276 lb 4 oz (125.306 kg)  09/03/12 280 lb 8 oz (127.234 kg)  07/14/12 277 lb 4 oz (125.76 kg)    General: Appears her stated age, obese but well developed, well nourished in NAD. Cardiovascular: Normal rate and rhythm. S1,S2 noted.  No murmur, rubs or gallops noted. No JVD or BLE edema. No carotid bruits noted. Pulmonary/Chest: Normal effort and positive vesicular breath sounds. No respiratory distress. No wheezes, rales or ronchi noted.  Musculoskeletal: Normal flexion and extension of the back. Normal rotation of the back. Pinpoint pain over the right SI joint. Strength 5/5 BLE. Neurological: Alert and oriented. Positive straight leg raise. Coordination normal. +DTRs bilaterally.   BMET    Component Value Date/Time   NA 135 01/22/2012 0555   K 3.5 01/22/2012 0555   CL 103 01/22/2012 0555   CO2 24 01/22/2012 0555   GLUCOSE 85 01/22/2012 0555   BUN 8 01/22/2012 0555   CREATININE 0.61 01/22/2012 0555   CALCIUM 7.7* 01/22/2012 0555   GFRNONAA >90 01/22/2012 0555   GFRAA >90 01/22/2012 0555    Lipid Panel     Component Value Date/Time   CHOL 129 01/20/2011 1104   TRIG 92.0 01/20/2011 1104   HDL 39.80 01/20/2011 1104   CHOLHDL 3 01/20/2011 1104   VLDL 18.4 01/20/2011 1104   LDLCALC 71 01/20/2011 1104    CBC    Component Value Date/Time   WBC 9.5 01/22/2012 0555   RBC 3.90 01/22/2012 0555   HGB 10.2* 01/22/2012 0555   HCT 31.5* 01/22/2012 0555   PLT 152 01/22/2012 0555   MCV 80.8 01/22/2012 0555   MCH 26.2 01/22/2012 0555   MCHC 32.4 01/22/2012 0555   RDW 14.5 01/22/2012 0555   MONOABS 0.6 08/19/2006 1141   EOSABS 0.1 08/19/2006 1141   BASOSABS 0.0 08/19/2006 1141    Hgb A1C No results found for this basename: HGBA1C        Assessment & Plan:   Low back pain:  eRx for Ibuprofen for pain an  inflammation Continue Hydrocodone and Robaxin- refilled today Use a heating pad for comfort Stretching exercises given  RTC as needed or if symptoms persist or worsen

## 2013-06-24 NOTE — Progress Notes (Signed)
Pre visit review using our clinic review tool, if applicable. No additional management support is needed unless otherwise documented below in the visit note. 

## 2013-06-24 NOTE — Patient Instructions (Addendum)
Back Exercises These exercises may help you when beginning to rehabilitate your injury. Your symptoms may resolve with or without further involvement from your physician, physical therapist or athletic trainer. While completing these exercises, remember:   Restoring tissue flexibility helps normal motion to return to the joints. This allows healthier, less painful movement and activity.  An effective stretch should be held for at least 30 seconds.  A stretch should never be painful. You should only feel a gentle lengthening or release in the stretched tissue. STRETCH  Extension, Prone on Elbows   Lie on your stomach on the floor, a bed will be too soft. Place your palms about shoulder width apart and at the height of your head.  Place your elbows under your shoulders. If this is too painful, stack pillows under your chest.  Allow your body to relax so that your hips drop lower and make contact more completely with the floor.  Hold this position for __________ seconds.  Slowly return to lying flat on the floor. Repeat __________ times. Complete this exercise __________ times per day.  RANGE OF MOTION  Extension, Prone Press Ups   Lie on your stomach on the floor, a bed will be too soft. Place your palms about shoulder width apart and at the height of your head.  Keeping your back as relaxed as possible, slowly straighten your elbows while keeping your hips on the floor. You may adjust the placement of your hands to maximize your comfort. As you gain motion, your hands will come more underneath your shoulders.  Hold this position __________ seconds.  Slowly return to lying flat on the floor. Repeat __________ times. Complete this exercise __________ times per day.  RANGE OF MOTION- Quadruped, Neutral Spine   Assume a hands and knees position on a firm surface. Keep your hands under your shoulders and your knees under your hips. You may place padding under your knees for comfort.  Drop  your head and point your tail bone toward the ground below you. This will round out your low back like an angry cat. Hold this position for __________ seconds.  Slowly lift your head and release your tail bone so that your back sags into a large arch, like an old horse.  Hold this position for __________ seconds.  Repeat this until you feel limber in your low back.  Now, find your "sweet spot." This will be the most comfortable position somewhere between the two previous positions. This is your neutral spine. Once you have found this position, tense your stomach muscles to support your low back.  Hold this position for __________ seconds. Repeat __________ times. Complete this exercise __________ times per day.  STRETCH  Flexion, Single Knee to Chest   Lie on a firm bed or floor with both legs extended in front of you.  Keeping one leg in contact with the floor, bring your opposite knee to your chest. Hold your leg in place by either grabbing behind your thigh or at your knee.  Pull until you feel a gentle stretch in your low back. Hold __________ seconds.  Slowly release your grasp and repeat the exercise with the opposite side. Repeat __________ times. Complete this exercise __________ times per day.  STRETCH - Hamstrings, Standing  Stand or sit and extend your right / left leg, placing your foot on a chair or foot stool  Keeping a slight arch in your low back and your hips straight forward.  Lead with your chest and   lean forward at the waist until you feel a gentle stretch in the back of your right / left knee or thigh. (When done correctly, this exercise requires leaning only a small distance.)  Hold this position for __________ seconds. Repeat __________ times. Complete this stretch __________ times per day. STRENGTHENING  Deep Abdominals, Pelvic Tilt   Lie on a firm bed or floor. Keeping your legs in front of you, bend your knees so they are both pointed toward the ceiling and  your feet are flat on the floor.  Tense your lower abdominal muscles to press your low back into the floor. This motion will rotate your pelvis so that your tail bone is scooping upwards rather than pointing at your feet or into the floor.  With a gentle tension and even breathing, hold this position for __________ seconds. Repeat __________ times. Complete this exercise __________ times per day.  STRENGTHENING  Abdominals, Crunches   Lie on a firm bed or floor. Keeping your legs in front of you, bend your knees so they are both pointed toward the ceiling and your feet are flat on the floor. Cross your arms over your chest.  Slightly tip your chin down without bending your neck.  Tense your abdominals and slowly lift your trunk high enough to just clear your shoulder blades. Lifting higher can put excessive stress on the low back and does not further strengthen your abdominal muscles.  Control your return to the starting position. Repeat __________ times. Complete this exercise __________ times per day.  STRENGTHENING  Quadruped, Opposite UE/LE Lift   Assume a hands and knees position on a firm surface. Keep your hands under your shoulders and your knees under your hips. You may place padding under your knees for comfort.  Find your neutral spine and gently tense your abdominal muscles so that you can maintain this position. Your shoulders and hips should form a rectangle that is parallel with the floor and is not twisted.  Keeping your trunk steady, lift your right hand no higher than your shoulder and then your left leg no higher than your hip. Make sure you are not holding your breath. Hold this position __________ seconds.  Continuing to keep your abdominal muscles tense and your back steady, slowly return to your starting position. Repeat with the opposite arm and leg. Repeat __________ times. Complete this exercise __________ times per day. Document Released: 05/02/2005 Document  Revised: 07/07/2011 Document Reviewed: 07/27/2008 [employer] Patient Information 2014 [employer].  

## 2013-10-12 ENCOUNTER — Encounter: Payer: Self-pay | Admitting: Family Medicine

## 2013-10-12 ENCOUNTER — Ambulatory Visit (INDEPENDENT_AMBULATORY_CARE_PROVIDER_SITE_OTHER): Payer: BC Managed Care – PPO | Admitting: Family Medicine

## 2013-10-12 VITALS — BP 130/90 | HR 64 | Temp 98.5°F | Wt 275.5 lb

## 2013-10-12 DIAGNOSIS — N390 Urinary tract infection, site not specified: Secondary | ICD-10-CM

## 2013-10-12 DIAGNOSIS — F418 Other specified anxiety disorders: Secondary | ICD-10-CM

## 2013-10-12 DIAGNOSIS — F341 Dysthymic disorder: Secondary | ICD-10-CM

## 2013-10-12 DIAGNOSIS — F411 Generalized anxiety disorder: Secondary | ICD-10-CM | POA: Insufficient documentation

## 2013-10-12 DIAGNOSIS — R3915 Urgency of urination: Secondary | ICD-10-CM

## 2013-10-12 LAB — POCT URINALYSIS DIPSTICK
Bilirubin, UA: NEGATIVE
Glucose, UA: NEGATIVE
Ketones, UA: NEGATIVE
Nitrite, UA: NEGATIVE
Protein, UA: NEGATIVE
Spec Grav, UA: 1.01
Urobilinogen, UA: 0.2
pH, UA: 7.5

## 2013-10-12 MED ORDER — CITALOPRAM HYDROBROMIDE 10 MG PO TABS: 10.0000 mg | ORAL_TABLET | Freq: Every day | ORAL | Status: DC

## 2013-10-12 MED ORDER — SULFAMETHOXAZOLE-TMP DS 800-160 MG PO TABS: 1.0000 | ORAL_TABLET | Freq: Two times a day (BID) | ORAL | Status: DC

## 2013-10-12 NOTE — Assessment & Plan Note (Signed)
Story and UA suspicious although micro not impressive. UCx sent. Treat with 3d bactrim DS bid course. Update if sxs persist or fail to improve.

## 2013-10-12 NOTE — Progress Notes (Signed)
Pre visit review using our clinic review tool, if applicable. No additional management support is needed unless otherwise documented below in the visit note. 

## 2013-10-12 NOTE — Patient Instructions (Signed)
For possible UTI - urine culture sent. Treat with antibiotic twice daily for 3 days (bactrim sent to pharmacy) For mood - I recommend starting celexa 10mg  daily.  This will take 3-4 weeks to take full effect. Return in 2 months for follow up or as needed. Work on healthy stress relieving strategies to help control anxiety as well. Good to see you today, call us with questions.

## 2013-10-12 NOTE — Assessment & Plan Note (Signed)
Support provided. Anticipate combination of MDD with some new anxiety. Pt declines counseling for now.  Will start celexa 10mg  daily. Discussed common side effects to monitor for including possible risk of increased suicidality if this happens to let us know immediately. Also discussed implementing healthy stress relieving strategies. rtc 2 mo for f/u.  PHQ9 = 7, very difficult to function GAD7 = 13/21.

## 2013-10-12 NOTE — Progress Notes (Signed)
BP 130/90  Pulse 64  Temp(Src) 98.5 F (36.9 C) (Oral)  Wt 275 lb 8 oz (124.966 kg)   CC: UTI?, discuss mood  Subjective:    Patient ID: Amy Costa, female    DOB: 07/04/1980, 33 y.o.   MRN: [id number]  HPI: Amy Costa is a 33 y.o. female presenting on 10/12/2013 for Medication Problem and Urinary Tract Infection   Recent injection this morning by Dr. Nelva Bush for cervical HNP.  UTI - sxs started a few weeks ago - increased frequency and urgency with nocturia, odor. Last UTI was 07/2013. No dysuria, hematuria, abd pain, flank or back pain, nausea/vomiting.  Depression - prior on venlafaxine. This was stopped 3 months ago. Prior also on zoloft with good effect. Endorses increased anxiety as well which is new for her. Excessive worrying. Increased irritability and moodiness. Has withdrawn from people some. Sleeping ok. Appetite ok. Decreased energy, decreased concentration. Mild anhedonia. No SI/HI. Denies inciting stressor. Just got engaged. Should feel happy but doesn't.  Wt Readings from Last 3 Encounters:  10/12/13 275 lb 8 oz (124.966 kg)  06/24/13 276 lb 4 oz (125.306 kg)  09/03/12 280 lb 8 oz (127.234 kg)  Body mass index is 39.53 kg/(m^2).  Relevant past medical, surgical, family and social history reviewed and updated as indicated.  Allergies and medications reviewed and updated. Current Outpatient Prescriptions on File Prior to Visit  Medication Sig  . acetaminophen (TYLENOL) 500 MG tablet Take 1,000 mg by mouth every 6 (six) hours as needed. pain  . HYDROcodone-acetaminophen (NORCO/VICODIN) 5-325 MG per tablet Take 1 tablet by mouth every 6 (six) hours as needed for moderate pain.  Marland Kitchen ibuprofen (ADVIL,MOTRIN) 800 MG tablet Take 1 tablet (800 mg total) by mouth every 8 (eight) hours as needed.  Marland Kitchen levonorgestrel (MIRENA) 20 MCG/24HR IUD 1 each by Intrauterine route once.  . methocarbamol (ROBAXIN) 500 MG tablet Take 1 tablet (500 mg total) by mouth 2 (two) times  daily as needed for muscle spasms.  . [DISCONTINUED] atenolol (TENORMIN) 100 MG tablet Take 1 tablet (100 mg total) by mouth daily.  . [DISCONTINUED] norethindrone-ethinyl estradiol-iron (LOESTRIN FE 1.5/30) 1.5-30 MG-MCG tablet Take 1 tablet by mouth daily.     No current facility-administered medications on file prior to visit.    Review of Systems Per HPI unless specifically indicated above    Objective:    BP 130/90  Pulse 64  Temp(Src) 98.5 F (36.9 C) (Oral)  Wt 275 lb 8 oz (124.966 kg)  Physical Exam  Vitals reviewed. Constitutional: She appears well-developed and well-nourished. No distress.  Neck: No thyromegaly present.  Abdominal: Soft. Normal appearance and bowel sounds are normal. She exhibits no distension and no mass. There is no hepatosplenomegaly. There is no tenderness. There is no rigidity, no rebound, no guarding, no CVA tenderness and negative Murphy's sign.  Psychiatric:  Tears with discussion of depression   Results for orders placed in visit on 10/12/13  POCT URINALYSIS DIPSTICK      Result Value Ref Range   Color, UA Yellow     Clarity, UA Hazy     Glucose, UA Negative     Bilirubin, UA Negative     Ketones, UA Negative     Spec Grav, UA 1.010     Blood, UA Moderate     pH, UA 7.5     Protein, UA Negative     Urobilinogen, UA 0.2     Nitrite, UA Negative  Leukocytes, UA small (1+)        Assessment & Plan:   Problem List Items Addressed This Visit   UTI (urinary tract infection)     Story and UA suspicious although micro not impressive. UCx sent. Treat with 3d bactrim DS bid course. Update if sxs persist or fail to improve.    Relevant Medications      sulfamethoxazole-trimethoprim (BACTRIM/SEPTRA DS) tablet 800-160 mg   Other Relevant Orders      Urine culture   Depression with anxiety - Primary     Support provided. Anticipate combination of MDD with some new anxiety. Pt declines counseling for now.  Will start celexa 10mg  daily.  Discussed common side effects to monitor for including possible risk of increased suicidality if this happens to let us know immediately. Also discussed implementing healthy stress relieving strategies. rtc 2 mo for f/u.  PHQ9 = 7, very difficult to function GAD7 = 13/21.     Other Visit Diagnoses   Urgency of urination        Relevant Orders       POCT Urinalysis Dipstick (Completed)        Follow up plan: Return in about 2 months (around 12/12/2013), or if symptoms worsen or fail to improve, for follow up visit.

## 2013-10-15 LAB — URINE CULTURE: Colony Count: >=100000

## 2013-11-18 ENCOUNTER — Encounter: Payer: Self-pay | Admitting: Family Medicine

## 2013-11-18 ENCOUNTER — Ambulatory Visit (INDEPENDENT_AMBULATORY_CARE_PROVIDER_SITE_OTHER): Payer: BC Managed Care – PPO | Admitting: Family Medicine

## 2013-11-18 VITALS — BP 130/82 | Temp 98.5°F | Ht 70.0 in | Wt 277.0 lb

## 2013-11-18 DIAGNOSIS — R350 Frequency of micturition: Secondary | ICD-10-CM

## 2013-11-18 DIAGNOSIS — N390 Urinary tract infection, site not specified: Secondary | ICD-10-CM

## 2013-11-18 LAB — POCT URINALYSIS DIPSTICK
Bilirubin, UA: NEGATIVE
Blood, UA: NEGATIVE
Glucose, UA: NEGATIVE
Ketones, UA: NEGATIVE
Nitrite, UA: NEGATIVE
Protein, UA: NEGATIVE
Spec Grav, UA: 1.01
Urobilinogen, UA: 0.2
pH, UA: 7

## 2013-11-18 MED ORDER — CIPROFLOXACIN HCL 500 MG PO TABS: 500.0000 mg | ORAL_TABLET | Freq: Two times a day (BID) | ORAL | Status: DC

## 2013-11-18 NOTE — Progress Notes (Signed)
   Subjective:    Patient ID: Amy Costa, female    DOB: 04-Jun-1980, 33 y.o.   MRN: [id number]  HPI Here for 2 days of urinary frequency and low back pain. No burning or nausea or fever. She was treated for a pan sensitive E coli last month with bactrim.    Review of Systems  Constitutional: Negative.   Gastrointestinal: Negative.   Genitourinary: Positive for urgency and frequency. Negative for dysuria, hematuria and flank pain.       Objective:   Physical Exam  Constitutional: She appears well-developed and well-nourished.  Abdominal: Soft. Bowel sounds are normal. She exhibits no distension and no mass. There is no tenderness. There is no rebound and no guarding.          Assessment & Plan:  Treat with Cipro and reculture

## 2013-11-18 NOTE — Progress Notes (Signed)
Pre visit review using our clinic review tool, if applicable. No additional management support is needed unless otherwise documented below in the visit note. 

## 2013-11-20 LAB — URINE CULTURE: Colony Count: 30000

## 2013-12-27 ENCOUNTER — Encounter: Payer: Self-pay | Admitting: Family Medicine

## 2013-12-27 ENCOUNTER — Ambulatory Visit (INDEPENDENT_AMBULATORY_CARE_PROVIDER_SITE_OTHER)
Admission: RE | Admit: 2013-12-27 | Discharge: 2013-12-27 | Disposition: A | Discharge status: Home / Self Care | Payer: BC Managed Care – PPO | Source: Ambulatory Visit | Attending: Family Medicine | Admitting: Family Medicine

## 2013-12-27 ENCOUNTER — Ambulatory Visit (INDEPENDENT_AMBULATORY_CARE_PROVIDER_SITE_OTHER): Payer: BC Managed Care – PPO | Admitting: Family Medicine

## 2013-12-27 VITALS — BP 138/86 | HR 57 | Temp 98.4°F | Ht 70.0 in | Wt 277.5 lb

## 2013-12-27 DIAGNOSIS — M79609 Pain in unspecified limb: Secondary | ICD-10-CM

## 2013-12-27 DIAGNOSIS — M79671 Pain in right foot: Secondary | ICD-10-CM

## 2013-12-27 NOTE — Progress Notes (Signed)
Subjective:    Patient ID: Amy Costa, female    DOB: January 29, 1981, 33 y.o.   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 UTI (urinary tract infection) 10/12/2013  . Depression with anxiety 10/12/2013  . HTN (hypertension) 01/20/2011  . MIGRAINE HEADACHE 04/28/2008  . Cervicalgia 03/09/2008  . HEADACHE 03/09/2008  . HPV 05/21/2007  . GRIEF REACTION 05/03/2007   Past Medical History  Diagnosis Date  . DDD (degenerative disc disease), cervical 06/2010    C5/6, C6/7 disc protrusions with deformity of thecal sac and cervical cord  . Migraine     Dr. Sharlet Salina at [facility]  . Insomnia, unspecified   . HPV (human papilloma virus) infection   . Depression     on zoloft for years  . Hypertension   . Pre-eclampsia    Past Surgical History  Procedure Laterality Date  . Arm wound repair / closure    . Back surgery  07/2011 (last injection)    pt reports several cervical spine injections for degenerative disc  . Cesarean section  01/21/2012    Procedure: CESAREAN SECTION;  Surgeon: Margarette Asal, MD;  Location: [facility];  Service: Obstetrics;  Laterality: N/A;  . Myomectomy  01/21/2012    Procedure: MYOMECTOMY;  Surgeon: Margarette Asal, MD;  Location: [facility];  Service: Obstetrics;  Laterality: N/A;  . Mri  2010    [facility] - showing bulging disc C5/6   History  Substance Use Topics  . Smoking status: Never Smoker   . Smokeless tobacco: Never Used  . Alcohol Use: Yes     Comment: Occasional   Family History  Problem Relation Age  of Onset  . COPD Father     + smoker  . Heart failure Father     CHF  . Hypertension Father   . Stroke Father   . Seizures Father   . Lung cancer Father     + smoker  . Healthy Mother   . Healthy Sister   . Healthy Sister   . Diabetes Paternal Grandfather   . Heart attack Maternal Grandmother   . Heart attack Maternal Grandfather   . Alcohol abuse Paternal Grandfather   . Alcohol abuse Maternal Grandfather   . Depression Father   . Depression Mother   . Depression Maternal Grandmother   . Lung cancer Paternal Grandmother     + smoker   No Known Allergies Current Outpatient Prescriptions on File Prior to Visit  Medication Sig Dispense Refill  . acetaminophen (TYLENOL) 500 MG tablet Take 1,000 mg by mouth every 6 (six) hours as needed. pain      . citalopram (CELEXA) 10 MG tablet Take 1 tablet (10 mg total) by mouth daily.  30 tablet  6  . HYDROcodone-acetaminophen (NORCO/VICODIN) 5-325 MG per tablet Take 1 tablet by mouth every 6 (six) hours as needed for moderate pain.  20 tablet  0  . ibuprofen (ADVIL,MOTRIN) 800 MG tablet Take 1 tablet (800 mg total) by mouth every 8 (eight) hours as needed.  30 tablet  0  . levonorgestrel (MIRENA) 20 MCG/24HR IUD 1 each by Intrauterine route once.      . methocarbamol (ROBAXIN) 500 MG tablet Take 1 tablet (500 mg total) by mouth 2 (two) times daily as needed for muscle spasms.  20 tablet  0  . [DISCONTINUED] atenolol (TENORMIN) 100 MG tablet Take 1 tablet (100 mg total) by mouth daily.  90 tablet  3  . [DISCONTINUED] norethindrone-ethinyl estradiol-iron (LOESTRIN FE 1.5/30) 1.5-30 MG-MCG tablet Take 1 tablet by mouth daily.         No current facility-administered medications on file prior to visit.     Review of Systems Review of Systems  Constitutional: Negative for fever, appetite change, fatigue and unexpected weight change.  Eyes: Negative for pain and visual disturbance.  Respiratory: Negative for cough and shortness of breath.     Cardiovascular: Negative for cp or palpitations    Gastrointestinal: Negative for nausea, diarrhea and constipation.  Genitourinary: Negative for urgency and frequency.  Skin: Negative for pallor or rash   MSK pos for R foot pain worse with activity, neg for other joint changes  Neurological: Negative for weakness, light-headedness, numbness and headaches.  Hematological: Negative for adenopathy. Does not bruise/bleed easily.  Psychiatric/Behavioral: Negative for dysphoric mood. The patient is not nervous/anxious.         Objective:   Physical Exam  Constitutional: She appears well-developed and well-nourished. No distress.  obese and well appearing   Musculoskeletal: She exhibits tenderness. She exhibits no edema.       Right foot: She exhibits tenderness and bony tenderness. She exhibits normal range of motion, no crepitus and no laceration.  R foot  Tender over calcaneous and at base of achilles  Tender over proximal 4th and 5th metatarsals  No bruising or swelling 1 cm tender knot felt at base of achilles   Nl perf and sens   Gait favors L foot   Neurological: She is alert. She has normal strength and normal reflexes. She displays no atrophy. No sensory deficit. She exhibits normal muscle tone. Coordination normal.  Skin: Skin is warm and dry. No erythema. No pallor.  Psychiatric: She has a normal mood and affect.          Assessment & Plan:   Problem List Items Addressed This Visit     Other   Right foot pain - Primary     Most of her pain is in heel/also a lump at base of achilles  Some tenderness in lateral forefoot  Obese and walks a lot  Xray today  Aleve as directed  Cold compresses/elevation -see AVS Plantar fasciitis and heel spur and stress fx are in the differential     Relevant Orders      DG Foot Complete Right (Completed)

## 2013-12-27 NOTE — Progress Notes (Signed)
Pre visit review using our clinic review tool, if applicable. No additional management support is needed unless otherwise documented below in the visit note. 

## 2013-12-27 NOTE — Assessment & Plan Note (Signed)
Most of her pain is in heel/also a lump at base of achilles  Some tenderness in lateral forefoot  Obese and walks a lot  Xray today  Aleve as directed  Cold compresses/elevation -see AVS Plantar fasciitis and heel spur and stress fx are in the differential

## 2013-12-27 NOTE — Patient Instructions (Signed)
Take aleve 1-2 pills with food twice daily for foot pain and inflammation  Use cold (roll foot over a frozen can or bottle) -especially in am for 5-10 minutes - whenever you can  Xray now We will update you with results Stay in a supportive shoe and do not go barefoot (athetic shoe instead of flip flop)

## 2014-01-04 ENCOUNTER — Encounter: Payer: Self-pay | Admitting: Family Medicine

## 2014-01-04 ENCOUNTER — Ambulatory Visit (INDEPENDENT_AMBULATORY_CARE_PROVIDER_SITE_OTHER): Payer: BC Managed Care – PPO | Admitting: Family Medicine

## 2014-01-04 VITALS — BP 110/80 | HR 50 | Temp 98.3°F | Ht 70.0 in | Wt 277.5 lb

## 2014-01-04 DIAGNOSIS — M7660 Achilles tendinitis, unspecified leg: Secondary | ICD-10-CM

## 2014-01-04 DIAGNOSIS — M7661 Achilles tendinitis, right leg: Secondary | ICD-10-CM

## 2014-01-04 NOTE — Progress Notes (Signed)
Pre visit review using our clinic review tool, if applicable. No additional management support is needed unless otherwise documented below in the visit note. 

## 2014-01-04 NOTE — Patient Instructions (Signed)
Achilles Rehab  Begin with easy walking, heel, toe and backwards  Calf raises on a step First lower and then raise on 1 foot If this is painful lower on 1 foot but do the heel raise on both feet  Begin with 3 sets of 10 repetitions  Increase by 5 repetitions every 3 days  Goal is 3 sets of 30 repetitions  Do with both knee straight and knee at 20 degrees of flexion  If pain persists at 3 sets of 30 - add backpack with 5 lbs Increase by 5 lbs per week to max of 30 lbs  

## 2014-01-04 NOTE — Progress Notes (Signed)
Dr. Frederico Hamman T. Hasina Kreager, MD, [facility] [place], [address] Phone: [phone] Fax: [phone]  01/04/2014  Patient: Amy Costa, MRN: [id number], DOB: 1980-06-26, 33 y.o.  Primary Physician:  Ria Bush, MD  Chief Complaint: Foot Pain  Subjective:   Amy Costa is a 33 y.o. very pleasant female patient who presents with the following:  A couple of months - knot now. .  Wearing some tennis shoes.   Pleasant patient who presents with a 2-3 mo R history of posterior heel pain.  No occult, abrupt onset. Has been more insidious in character. There is a dull ache present and worse with activity:  Prior home rehab: none Prior meds: none Orthosis / Braces: none   Some icing and NSAIDS  Past Medical History, Surgical History, Social History, Family History, Problem List, Medications, and Allergies have been reviewed and updated if relevant.  GEN: No fevers, chills. Nontoxic. Primarily MSK c/o today. MSK: Detailed in the HPI GI: tolerating PO intake without difficulty Neuro: No numbness, parasthesias, or tingling associated. Otherwise the pertinent positives of the ROS are noted above.   Objective:   BP 110/80  Pulse 50  Temp(Src) 98.3 F (36.8 C) (Oral)  Ht 5\' 10"  (1.778 m)  Wt 277 lb 8 oz (125.873 kg)  BMI 39.82 kg/m2  Breastfeeding? No   GEN: Well-developed,well-nourished,in no acute distress; alert,appropriate and cooperative throughout examination HEENT: Normocephalic and atraumatic without obvious abnormalities. Ears, externally no deformities PULM: Breathing comfortably in no respiratory distress EXT: No clubbing, cyanosis, or edema PSYCH: Normally interactive. Cooperative during the interview. Pleasant. Friendly and conversant. Not anxious or depressed appearing. Normal, full affect.  Foot: R Echymosis: no Edema: no ROM: full LE B Gait: heel toe, non-antalgic MT pain: no Callus  pattern: none Lateral Mall: NT Medial Mall: NT Talus: NT Navicular: NT Cuboid: NT Calcaneous: NT Metatarsals: NT 5th MT: NT Phalanges: NT Achilles: PAINFUL TO PALPATE AT INSERTION ON LEFT, moderate NODULE Plantar Fascia: NT Fat Pad: NT Peroneals: NT Post Tib: NT Great Toe: Nml motion Ant Drawer: neg ATFL: NT CFL: NT Deltoid: NT Sensation: intact   Radiology: Dg Foot Complete Right  12/27/2013   CLINICAL DATA:  Foot pain, worse in the heel appearing lump on the posterior heel. No trauma.  EXAM: RIGHT FOOT COMPLETE - 3+ VIEW  COMPARISON:  None.  FINDINGS: There is no evidence of fracture or dislocation. Note is made of an Achilles spur associated with if soft tissue convexity and possibly accounting for the patient's reported symptoms.  IMPRESSION: 1.  No evidence for acute  abnormality. 2. Achilles spur.   Electronically Signed   By: Shon Hale M.D.   On: 12/27/2013 16:01    Assessment and Plan:   Tendonitis, Achilles, right   Refer to the patient instructions sections for details of plan shared with patient.   Pathophysiology of achilles tendinopathy reviewed.  Additionally, I have given the patient the program emphasizing eccentric overloading detailed in the instructions based on Dr. Trudi Ida work and protocols.  Supportive footwear reviewed.  Heel cups given.  Follow-up: Return in about 2 months (around 03/06/2014).  Patient Instructions  Achilles Rehab  Begin with easy walking, heel, toe and backwards  Calf raises on a step First lower and then raise on 1 foot If this is painful lower on 1 foot but do the heel raise on both feet  Begin with 3 sets of 10 repetitions  Increase  by 5 repetitions every 3 days  Goal is 3 sets of 30 repetitions  Do with both knee straight and knee at 20 degrees of flexion  If pain persists at 3 sets of 30 - add backpack with 5 lbs Increase by 5 lbs per week to max of 30 lbs      Signed,  Kinslei Labine T. Asees Manfredi,  MD   Patient's Medications  New Prescriptions   No medications on file  Previous Medications   ACETAMINOPHEN (TYLENOL) 500 MG TABLET    Take 1,000 mg by mouth every 6 (six) hours as needed. pain   CITALOPRAM (CELEXA) 10 MG TABLET    Take 1 tablet (10 mg total) by mouth daily.   HYDROCODONE-ACETAMINOPHEN (NORCO/VICODIN) 5-325 MG PER TABLET    Take 1 tablet by mouth every 6 (six) hours as needed for moderate pain.   IBUPROFEN (ADVIL,MOTRIN) 800 MG TABLET    Take 1 tablet (800 mg total) by mouth every 8 (eight) hours as needed.   LEVONORGESTREL (MIRENA) 20 MCG/24HR IUD    1 each by Intrauterine route once.   METHOCARBAMOL (ROBAXIN) 500 MG TABLET    Take 1 tablet (500 mg total) by mouth 2 (two) times daily as needed for muscle spasms.   NAPROXEN SODIUM (ALEVE) 220 MG TABLET    Take 220 mg by mouth 2 (two) times daily with a meal.  Modified Medications   No medications on file  Discontinued Medications   No medications on file

## 2014-02-27 ENCOUNTER — Encounter: Payer: Self-pay | Admitting: Family Medicine

## 2014-06-16 ENCOUNTER — Other Ambulatory Visit: Payer: Self-pay | Admitting: Family Medicine

## 2014-06-29 ENCOUNTER — Encounter: Payer: Self-pay | Admitting: Family Medicine

## 2014-06-29 ENCOUNTER — Ambulatory Visit (INDEPENDENT_AMBULATORY_CARE_PROVIDER_SITE_OTHER): Payer: BLUE CROSS/BLUE SHIELD | Admitting: Family Medicine

## 2014-06-29 VITALS — BP 120/78 | HR 92 | Temp 98.5°F | Ht 70.0 in | Wt 276.5 lb

## 2014-06-29 DIAGNOSIS — M722 Plantar fascial fibromatosis: Secondary | ICD-10-CM

## 2014-06-29 NOTE — Progress Notes (Signed)
Dr. Frederico Hamman T. Milan Clare, MD, [facility] and Sports Medicine [place], [address] Phone: [phone] Fax: [phone]  06/29/2014  Patient: Amy Costa, MRN: [id number], DOB: 1980-06-04, 34 y.o.  Primary Physician:  Ria Bush, MD  Chief Complaint: Foot Pain  Subjective:   This 34 y.o. female patient presents with a 2 mo long history of heel pain. This is notable for worsening pain first thing in the morning when arising and standing after sitting.   Prior foot or ankle fractures: none Prior operations: none Orthotics or bracing: Tuli's heel cups Medications: OTC NSAIDS and Tylenol PT or home rehab: none Night splints: no Ice massage: no Ball massage: no  Metatarsal pain: no  The PMH, PSH, Social History, Family History, Medications, and allergies have been reviewed in [facility], and have been updated if relevant.  GEN: No fevers, chills. Nontoxic. Primarily MSK c/o today. MSK: Detailed in the HPI GI: tolerating PO intake without difficulty Neuro: No numbness, parasthesias, or tingling associated. Otherwise the pertinent positives of the ROS are noted above.   Objective:   Blood pressure 120/78, pulse 92, temperature 98.5 F (36.9 C), temperature source Oral, height 5\' 10"  (1.778 m), weight 276 lb 8 oz (125.42 kg).  GEN: Well-developed,well-nourished,in no acute distress; alert,appropriate and cooperative throughout examination HEENT: Normocephalic and atraumatic without obvious abnormalities. Ears, externally no deformities PULM: Breathing comfortably in no respiratory distress EXT: No clubbing, cyanosis, or edema PSYCH: Normally interactive. Cooperative during the interview. Pleasant. Friendly and conversant. Not anxious or depressed appearing. Normal, full affect.  L foot Echymosis: no Edema: no ROM: full LE B Gait: heel toe, non-antalgic MT pain: no Callus pattern: none Lateral Mall: NT Medial Mall: NT Talus:  NT Navicular: NT Calcaneous: NT Metatarsals: NT 5th MT: NT Phalanges: NT Achilles: NT Plantar Fascia: tender, medial along PF. Pain with forced dorsi Fat Pad: NT Peroneals: NT Post Tib: NT Great Toe: Nml motion Ant Drawer: neg Other foot breakdown: none Long arch: preserved Transverse arch: preserved Hindfoot breakdown: none Sensation: intact  Assessment and Plan:   Plantar fasciitis, left  >25 minutes spent in face to face time with patient, >50% spent in counselling or coordination of care  Anatomy reviewed. Stretching and rehab are critically important to the treatment of PF. Reviewed footwear. Rigid soles have been shown to help with PF.  Reviewed rehab of stretching and calf raises.  Reviewed rehab from [facility]  Could benefit from a corticosteroid injection if conservative treatment fails. Given arch binder  Follow-up: 2 mo if not improving  Patient Instructions  Please read handouts on Plantar Fascitis.  STRETCHING and Strengthening program critically important.  Strengthening on foot and calf muscles as seen in handout. Calf raises, 2 legged, then 1 legged. Foot massage with tennis ball. Ice massage.  Towel Scrunches: get a towel or hand towel, use toes to pick up and scrunch up the towel.  Marble pick-ups, practice picking up marbles with toes and placing into a cup  NEEDS TO BE DONE EVERY DAY  Recommended over the counter insoles. (Spenco or Hapad)  A rigid shoe with good arch support helps: Dansko (great), Jennet Maduro, Merrell No easily bendable shoes.        Signed,  Maud Deed. Tammala Weider, MD   Patient's Medications  New Prescriptions   No medications on file  Previous Medications   ACETAMINOPHEN (TYLENOL) 500 MG TABLET    Take 1,000 mg by mouth every  6 (six) hours as needed. pain   CITALOPRAM (CELEXA) 10 MG TABLET    TAKE 1 TABLET (10 MG TOTAL) BY MOUTH DAILY.   CYCLOBENZAPRINE (FLEXERIL) 10 MG TABLET        HYDROCODONE-ACETAMINOPHEN (NORCO/VICODIN) 5-325 MG PER TABLET    Take 1 tablet by mouth every 6 (six) hours as needed for moderate pain.   IBUPROFEN (ADVIL,MOTRIN) 800 MG TABLET    Take 1 tablet (800 mg total) by mouth every 8 (eight) hours as needed.   LEVONORGESTREL (MIRENA) 20 MCG/24HR IUD    1 each by Intrauterine route once.   NAPROXEN SODIUM (ALEVE) 220 MG TABLET    Take 220 mg by mouth 2 (two) times daily with a meal.  Modified Medications   No medications on file  Discontinued Medications   METHOCARBAMOL (ROBAXIN) 500 MG TABLET    Take 1 tablet (500 mg total) by mouth 2 (two) times daily as needed for muscle spasms.

## 2014-06-29 NOTE — Patient Instructions (Signed)
Please read handouts on Plantar Fascitis.  STRETCHING and Strengthening program critically important.  Strengthening on foot and calf muscles as seen in handout. Calf raises, 2 legged, then 1 legged. Foot massage with tennis ball. Ice massage.  Towel Scrunches: get a towel or hand towel, use toes to pick up and scrunch up the towel.  Marble pick-ups, practice picking up marbles with toes and placing into a cup  NEEDS TO BE DONE EVERY DAY  Recommended over the counter insoles. (Spenco or Hapad)  A rigid shoe with good arch support helps: Dansko (great), Jennet Maduro, Merrell No easily bendable shoes.

## 2014-06-29 NOTE — Progress Notes (Signed)
Pre visit review using our clinic review tool, if applicable. No additional management support is needed unless otherwise documented below in the visit note. 

## 2014-08-07 ENCOUNTER — Other Ambulatory Visit: Payer: Self-pay | Admitting: Family Medicine

## 2014-08-07 DIAGNOSIS — Z13220 Encounter for screening for lipoid disorders: Secondary | ICD-10-CM

## 2014-08-07 DIAGNOSIS — I10 Essential (primary) hypertension: Secondary | ICD-10-CM

## 2014-08-07 DIAGNOSIS — D649 Anemia, unspecified: Secondary | ICD-10-CM

## 2014-08-08 ENCOUNTER — Other Ambulatory Visit (INDEPENDENT_AMBULATORY_CARE_PROVIDER_SITE_OTHER): Payer: BLUE CROSS/BLUE SHIELD

## 2014-08-08 DIAGNOSIS — Z13220 Encounter for screening for lipoid disorders: Secondary | ICD-10-CM | POA: Diagnosis not present

## 2014-08-08 DIAGNOSIS — I10 Essential (primary) hypertension: Secondary | ICD-10-CM

## 2014-08-08 DIAGNOSIS — D649 Anemia, unspecified: Secondary | ICD-10-CM

## 2014-08-08 LAB — LIPID PANEL
Cholesterol: 129 mg/dL (ref 0–200)
HDL: 38.3 mg/dL — ABNORMAL LOW (ref >39.00)
LDL Cholesterol: 77 mg/dL (ref 0–99)
NonHDL: 90.7
Total CHOL/HDL Ratio: 3
Triglycerides: 71 mg/dL (ref 0.0–149.0)
VLDL: 14.2 mg/dL (ref 0.0–40.0)

## 2014-08-08 LAB — CBC WITH DIFFERENTIAL/PLATELET
Basophils Absolute: 0 10*3/uL (ref 0.0–0.1)
Basophils Relative: 0.5 % (ref 0.0–3.0)
Eosinophils Absolute: 0.3 10*3/uL (ref 0.0–0.7)
Eosinophils Relative: 4 % (ref 0.0–5.0)
HCT: 41.7 % (ref 36.0–46.0)
Hemoglobin: 13.9 g/dL (ref 12.0–15.0)
Lymphocytes Relative: 32.3 % (ref 12.0–46.0)
Lymphs Abs: 2.5 10*3/uL (ref 0.7–4.0)
MCHC: 33.3 g/dL (ref 30.0–36.0)
MCV: 78.4 fl (ref 78.0–100.0)
Monocytes Absolute: 0.5 10*3/uL (ref 0.1–1.0)
Monocytes Relative: 6.6 % (ref 3.0–12.0)
Neutro Abs: 4.4 10*3/uL (ref 1.4–7.7)
Neutrophils Relative %: 56.6 % (ref 43.0–77.0)
Platelets: 235 10*3/uL (ref 150.0–400.0)
RBC: 5.32 Mil/uL — ABNORMAL HIGH (ref 3.87–5.11)
RDW: 13.7 % (ref 11.5–15.5)
WBC: 7.8 10*3/uL (ref 4.0–10.5)

## 2014-08-08 LAB — COMPREHENSIVE METABOLIC PANEL
ALT: 13 U/L (ref 0–35)
AST: 14 U/L (ref 0–37)
Albumin: 3.8 g/dL (ref 3.5–5.2)
Alkaline Phosphatase: 78 U/L (ref 39–117)
BUN: 11 mg/dL (ref 6–23)
CO2: 29 mEq/L (ref 19–32)
Calcium: 9 mg/dL (ref 8.4–10.5)
Chloride: 105 mEq/L (ref 96–112)
Creatinine, Ser: 0.62 mg/dL (ref 0.40–1.20)
GFR: 115.77 mL/min (ref >60.00)
Glucose, Bld: 75 mg/dL (ref 70–99)
Potassium: 4 mEq/L (ref 3.5–5.1)
Sodium: 137 mEq/L (ref 135–145)
Total Bilirubin: 0.4 mg/dL (ref 0.2–1.2)
Total Protein: 6.6 g/dL (ref 6.0–8.3)

## 2014-08-08 LAB — TSH: TSH: 2.11 u[IU]/mL (ref 0.35–4.50)

## 2014-08-15 ENCOUNTER — Ambulatory Visit (INDEPENDENT_AMBULATORY_CARE_PROVIDER_SITE_OTHER): Payer: BLUE CROSS/BLUE SHIELD | Admitting: Family Medicine

## 2014-08-15 ENCOUNTER — Encounter: Payer: Self-pay | Admitting: Family Medicine

## 2014-08-15 ENCOUNTER — Other Ambulatory Visit: Payer: Self-pay | Admitting: Obstetrics and Gynecology

## 2014-08-15 VITALS — BP 128/72 | HR 88 | Temp 98.2°F | Ht 70.25 in | Wt 275.8 lb

## 2014-08-15 DIAGNOSIS — Z8679 Personal history of other diseases of the circulatory system: Secondary | ICD-10-CM

## 2014-08-15 DIAGNOSIS — Z0000 Encounter for general adult medical examination without abnormal findings: Secondary | ICD-10-CM | POA: Diagnosis not present

## 2014-08-15 DIAGNOSIS — E786 Lipoprotein deficiency: Secondary | ICD-10-CM

## 2014-08-15 DIAGNOSIS — Z0001 Encounter for general adult medical examination with abnormal findings: Secondary | ICD-10-CM | POA: Insufficient documentation

## 2014-08-15 DIAGNOSIS — F418 Other specified anxiety disorders: Secondary | ICD-10-CM

## 2014-08-15 DIAGNOSIS — IMO0001 Reserved for inherently not codable concepts without codable children: Secondary | ICD-10-CM

## 2014-08-15 MED ORDER — CITALOPRAM HYDROBROMIDE 20 MG PO TABS: 20.0000 mg | ORAL_TABLET | Freq: Every day | ORAL | Status: DC

## 2014-08-15 NOTE — Assessment & Plan Note (Signed)
-  Increase celexa to 20mg daily

## 2014-08-15 NOTE — Assessment & Plan Note (Signed)
Preventative protocols reviewed and updated unless pt declined. Discussed healthy diet and lifestyle.  

## 2014-08-15 NOTE — Progress Notes (Signed)
Pre visit review using our clinic review tool, if applicable. No additional management support is needed unless otherwise documented below in the visit note. 

## 2014-08-15 NOTE — Assessment & Plan Note (Signed)
Discussed healthy diet and lifestyle changes to affect sustainable weight loss. Discussed importance of weight loss to optimize chances of healthy pregnancy. Body mass index is 39.31 kg/(m^2).

## 2014-08-15 NOTE — Patient Instructions (Addendum)
Let's try higher celexa dose at 20mg  daily. Update me with effect.  Return as needed or in 1 year for next physical. Good to see you today, call us with questions.

## 2014-08-15 NOTE — Assessment & Plan Note (Signed)
Encouraged increased aerobic exercise to improve HDL.  

## 2014-08-15 NOTE — Assessment & Plan Note (Signed)
Ho HTN and pre eclampsia. bp much better controlled recently, off meds. Discussed possible future pregnancy

## 2014-08-15 NOTE — Progress Notes (Signed)
BP 128/72 mmHg  Pulse 88  Temp(Src) 98.2 F (36.8 C) (Oral)  Ht 5' 10.25" (1.784 m)  Wt 275 lb 12.8 oz (125.102 kg)  BMI 39.31 kg/m2   CC: CPE  Subjective:    Patient ID: Amy Costa, female    DOB: 05-Dec-1980, 34 y.o.   MRN: [id number]  HPI: Amy Costa is a 34 y.o. female presenting on 08/15/2014 for Annual Exam   Migraines - followed by Dr Nelva Bush [place] [facility]. Has migraine every few weeks, not on preventative med.   Anxiety/depression - increased anxiety over last few months. Happy with new job promotion. More short tempered with 34yo  Preventative: Well woman exam - Dr Claudean Kinds - has upcoming appt this afternoon. Pt denies h/o myomectomy. Mirena placed late 2013. 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  Flu shot - does not receive Tdap 2013 Seat belt use discussed Sunscreen use discussed. No changing moles on skin.  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 Interim medical history since our last visit reviewed. Allergies and medications reviewed and updated. Current Outpatient Prescriptions on File Prior to Visit  Medication Sig  . acetaminophen (TYLENOL) 500 MG tablet Take 1,000 mg by mouth every 6 (six) hours as needed. pain  . cyclobenzaprine (FLEXERIL) 10 MG tablet   . HYDROcodone-acetaminophen (NORCO/VICODIN) 5-325 MG per tablet Take 1 tablet by mouth every 6 (six) hours as needed for moderate pain.  Marland Kitchen ibuprofen (ADVIL,MOTRIN) 800 MG tablet Take 1 tablet (800 mg total) by mouth every 8 (eight) hours as needed.  Marland Kitchen levonorgestrel (MIRENA) 20 MCG/24HR IUD 1 each by Intrauterine route once.  . naproxen sodium (ALEVE) 220 MG tablet Take 220 mg by mouth 2 (two) times daily with a meal.  . [DISCONTINUED] atenolol (TENORMIN) 100 MG tablet Take 1 tablet (100 mg total) by mouth  daily.  . [DISCONTINUED] norethindrone-ethinyl estradiol-iron (LOESTRIN FE 1.5/30) 1.5-30 MG-MCG tablet Take 1 tablet by mouth daily.     No current facility-administered medications on file prior to visit.    Review of Systems  Constitutional: Negative for fever, chills, activity change, appetite change, fatigue and unexpected weight change.  HENT: Negative for hearing loss.   Eyes: Negative for visual disturbance.  Respiratory: Negative for cough, chest tightness, shortness of breath and wheezing.   Cardiovascular: Negative for chest pain, palpitations and leg swelling.  Gastrointestinal: Negative for nausea, vomiting, abdominal pain, diarrhea, constipation, blood in stool and abdominal distention.  Genitourinary: Negative for hematuria and difficulty urinating.  Musculoskeletal: Negative for myalgias, arthralgias and neck pain.  Skin: Negative for rash.  Neurological: Positive for headaches (see HPI). Negative for dizziness, seizures and syncope.  Hematological: Negative for adenopathy. Does not bruise/bleed easily.  Psychiatric/Behavioral: Positive for dysphoric mood. The patient is nervous/anxious.    Per HPI unless specifically indicated above     Objective:    BP 128/72 mmHg  Pulse 88  Temp(Src) 98.2 F (36.8 C) (Oral)  Ht 5' 10.25" (1.784 m)  Wt 275 lb 12.8 oz (125.102 kg)  BMI 39.31 kg/m2  Wt Readings from Last 3 Encounters:  08/15/14 275 lb 12.8 oz (125.102 kg)  06/29/14 276 lb 8 oz (125.42 kg)  01/04/14 277 lb 8 oz (125.873 kg)    Physical Exam  Constitutional: She is oriented to person, place, and time. She appears well-developed  and well-nourished. No distress.  HENT:  Head: Normocephalic and atraumatic.  Right Ear: Hearing, tympanic membrane, external ear and ear canal normal.  Left Ear: Hearing, tympanic membrane, external ear and ear canal normal.  Nose: Nose normal.  Mouth/Throat: Uvula is midline, oropharynx is clear and moist and mucous membranes are  normal. No oropharyngeal exudate, posterior oropharyngeal edema or posterior oropharyngeal erythema.  Eyes: Conjunctivae and EOM are normal. Pupils are equal, round, and reactive to light. No scleral icterus.  Neck: Normal range of motion. Neck supple. No thyromegaly present.  Cardiovascular: Normal rate, regular rhythm, normal heart sounds and intact distal pulses.   No murmur heard. Pulses:      Radial pulses are 2+ on the right side, and 2+ on the left side.  Pulmonary/Chest: Effort normal and breath sounds normal. No respiratory distress. She has no wheezes. She has no rales.  Abdominal: Soft. Bowel sounds are normal. She exhibits no distension and no mass. There is no tenderness. There is no rebound and no guarding.  Musculoskeletal: Normal range of motion. She exhibits no edema.  Lymphadenopathy:    She has no cervical adenopathy.  Neurological: She is alert and oriented to person, place, and time.  CN grossly intact, station and gait intact  Skin: Skin is warm and dry. No rash noted.  Psychiatric: She has a normal mood and affect. Her behavior is normal. Judgment and thought content normal.  Nursing note and vitals reviewed.  Results for orders placed or performed in visit on 08/08/14  Comprehensive metabolic panel  Result Value Ref Range   Sodium 137 135 - 145 mEq/L   Potassium 4.0 3.5 - 5.1 mEq/L   Chloride 105 96 - 112 mEq/L   CO2 29 19 - 32 mEq/L   Glucose, Bld 75 70 - 99 mg/dL   BUN 11 6 - 23 mg/dL   Creatinine, Ser 0.62 0.40 - 1.20 mg/dL   Total Bilirubin 0.4 0.2 - 1.2 mg/dL   Alkaline Phosphatase 78 39 - 117 U/L   AST 14 0 - 37 U/L   ALT 13 0 - 35 U/L   Total Protein 6.6 6.0 - 8.3 g/dL   Albumin 3.8 3.5 - 5.2 g/dL   Calcium 9.0 8.4 - 10.5 mg/dL   GFR 115.77 >60.00 mL/min  Lipid panel  Result Value Ref Range   Cholesterol 129 0 - 200 mg/dL   Triglycerides 71.0 0.0 - 149.0 mg/dL   HDL 38.30 (L) >39.00 mg/dL   VLDL 14.2 0.0 - 40.0 mg/dL   LDL Cholesterol 77 0 - 99  mg/dL   Total CHOL/HDL Ratio 3    NonHDL 90.70   TSH  Result Value Ref Range   TSH 2.11 0.35 - 4.50 uIU/mL  CBC with Differential/Platelet  Result Value Ref Range   WBC 7.8 4.0 - 10.5 K/uL   RBC 5.32 (H) 3.87 - 5.11 Mil/uL   Hemoglobin 13.9 12.0 - 15.0 g/dL   HCT 41.7 36.0 - 46.0 %   MCV 78.4 78.0 - 100.0 fl   MCHC 33.3 30.0 - 36.0 g/dL   RDW 13.7 11.5 - 15.5 %   Platelets 235.0 150.0 - 400.0 K/uL   Neutrophils Relative % 56.6 43.0 - 77.0 %   Lymphocytes Relative 32.3 12.0 - 46.0 %   Monocytes Relative 6.6 3.0 - 12.0 %   Eosinophils Relative 4.0 0.0 - 5.0 %   Basophils Relative 0.5 0.0 - 3.0 %   Neutro Abs 4.4 1.4 - 7.7 K/uL  Lymphs Abs 2.5 0.7 - 4.0 K/uL   Monocytes Absolute 0.5 0.1 - 1.0 K/uL   Eosinophils Absolute 0.3 0.0 - 0.7 K/uL   Basophils Absolute 0.0 0.0 - 0.1 K/uL      Assessment & Plan:   Problem List Items Addressed This Visit    Obesity, Class II, BMI 35-39.9, with comorbidity    Discussed healthy diet and lifestyle changes to affect sustainable weight loss. Discussed importance of weight loss to optimize chances of healthy pregnancy. Body mass index is 39.31 kg/(m^2).       Low HDL (under 40)    Encouraged increased aerobic exercise to improve HDL      History of hypertension    Ho HTN and pre eclampsia. bp much better controlled recently, off meds. Discussed possible future pregnancy      Health maintenance examination - Primary    Preventative protocols reviewed and updated unless pt declined. Discussed healthy diet and lifestyle.       Depression with anxiety    Increase celexa to 20mg  daily.          Follow up plan: Return in about 1 year (around 08/15/2015), or as needed, for annual exam, prior fasting for blood work.

## 2014-08-16 LAB — CYTOLOGY - PAP

## 2014-10-05 ENCOUNTER — Ambulatory Visit (INDEPENDENT_AMBULATORY_CARE_PROVIDER_SITE_OTHER): Payer: BLUE CROSS/BLUE SHIELD | Admitting: Podiatry

## 2014-10-05 ENCOUNTER — Ambulatory Visit: Payer: Self-pay

## 2014-10-05 ENCOUNTER — Encounter: Payer: Self-pay | Admitting: Podiatry

## 2014-10-05 VITALS — BP 152/92 | HR 109 | Resp 12

## 2014-10-05 DIAGNOSIS — M7661 Achilles tendinitis, right leg: Secondary | ICD-10-CM

## 2014-10-05 DIAGNOSIS — M722 Plantar fascial fibromatosis: Secondary | ICD-10-CM | POA: Diagnosis not present

## 2014-10-05 MED ORDER — DICLOFENAC SODIUM 75 MG PO TBEC: 75.0000 mg | DELAYED_RELEASE_TABLET | Freq: Two times a day (BID) | ORAL | Status: DC

## 2014-10-05 MED ORDER — TRIAMCINOLONE ACETONIDE 10 MG/ML IJ SUSP
10.0000 mg | Freq: Once | INTRAMUSCULAR | Status: AC
  Administered 2014-10-05: 10 mg

## 2014-10-05 NOTE — Patient Instructions (Signed)

## 2014-10-05 NOTE — Progress Notes (Signed)
   Subjective:    Patient ID: Amy Costa, female    DOB: 10/21/1980, 35 y.o.   MRN: [id number]  HPI  Patient has BIL heel pain, mostly on the outside of heel. Sharp shooting pains, especially when she gets or stands up after being on her feet all day. Possible plantar fascitis. Concern about knot on back or R heel.  Review of Systems     Objective:   Physical Exam        Assessment & Plan:

## 2014-10-06 NOTE — Progress Notes (Signed)
Subjective:     Patient ID: Amy Costa, female   DOB: [birth year], 34 y.o.   MRN: [id number]  HPI patient presents stating she's had a lot of problems with the bottom of her left heel of approximate one year and that she started develop a lot of pain in the outside back of her right heel with a not that's making it hard to wear shoe gear or exercise.    Review of Systems  All other systems reviewed and are negative.      Objective:   Physical Exam  Cardiovascular: Intact distal pulses.   Musculoskeletal: Normal range of motion.  Neurological: She is alert.  Nursing note and vitals reviewed.  neurovascular status found to be intact muscle strength adequate with range of motion the subtalar midtarsal joint within normal limits. Patient's noted to have quite a bit of inflammation in the plantar aspect of the left heel at the insertional point of the tendon into the calcaneus with fluid buildup and is found to have irritation in the posterior aspect of the right heel lateral side with inflammation and fluid buildup. Patient has trouble wearing shoe gear or ambulating and is found to have good digital perfusion and is well oriented 3     Assessment:     Plantar fasciitis acute nature left along with posterior tendinitis of the Achilles lateral side right with spur formation    Plan:     H&P and condition reviewed with patient. Today I went ahead and I injected the left plantar fascia 3 mg Kenalog 5 mill grams Xylocaine Marcaine mixture and for the right I carefully injected the lateral side after explaining the chances for rupture with 3 mg dexamethasone Kenalog 5 mg Xylocaine and for the left I applied fascial brace and for the right I instructed on ice and reduced activity. Reappoint to recheck again in the next 2-3 weeks

## 2014-10-19 ENCOUNTER — Encounter: Payer: Self-pay | Admitting: Podiatry

## 2014-10-19 ENCOUNTER — Ambulatory Visit (INDEPENDENT_AMBULATORY_CARE_PROVIDER_SITE_OTHER): Payer: BLUE CROSS/BLUE SHIELD | Admitting: Podiatry

## 2014-10-19 VITALS — BP 131/68 | HR 93 | Resp 15

## 2014-10-19 DIAGNOSIS — M7661 Achilles tendinitis, right leg: Secondary | ICD-10-CM | POA: Diagnosis not present

## 2014-10-19 DIAGNOSIS — M722 Plantar fascial fibromatosis: Secondary | ICD-10-CM | POA: Diagnosis not present

## 2014-10-19 MED ORDER — TRIAMCINOLONE ACETONIDE 10 MG/ML IJ SUSP
10.0000 mg | Freq: Once | INTRAMUSCULAR | Status: AC
  Administered 2014-10-19: 10 mg

## 2014-10-19 NOTE — Progress Notes (Signed)
Subjective:     Patient ID: Amy Costa, female   DOB: 1980-09-06, 34 y.o.   MRN: [id number]  HPI patient states I've had some improvement but I am getting reoccurrence of symptoms   Review of Systems     Objective:   Physical Exam Neurovascular status intact muscle strength adequate range of motion within normal limits with quite a bit of discomfort in the plantar fascial left over right with inflammation and fluid buildup still noted and depression of the arch noted upon weightbearing    Assessment:     Plantar fasciitis left over right with inflammation and moderate depression of the arch noted    Plan:     Reinjected the plantar fascia 3 Milligan Kenalog 5 mill grams Xylocaine and applied night splint with instructions on usage and patient will be scanned for custom orthotic devices to reduce stress on her foot. Instructions on night splint usage were given to patient

## 2014-11-09 ENCOUNTER — Ambulatory Visit: Payer: BLUE CROSS/BLUE SHIELD | Admitting: Podiatry

## 2015-02-26 ENCOUNTER — Other Ambulatory Visit: Payer: Self-pay | Admitting: Obstetrics and Gynecology

## 2015-02-26 DIAGNOSIS — Z1231 Encounter for screening mammogram for malignant neoplasm of breast: Secondary | ICD-10-CM

## 2015-02-27 ENCOUNTER — Ambulatory Visit: Payer: BLUE CROSS/BLUE SHIELD | Attending: Obstetrics and Gynecology

## 2015-06-29 ENCOUNTER — Ambulatory Visit (INDEPENDENT_AMBULATORY_CARE_PROVIDER_SITE_OTHER): Payer: BLUE CROSS/BLUE SHIELD | Admitting: Family Medicine

## 2015-06-29 ENCOUNTER — Encounter: Payer: Self-pay | Admitting: Family Medicine

## 2015-06-29 VITALS — BP 130/90 | HR 92 | Temp 98.9°F | Ht 70.25 in | Wt 271.5 lb

## 2015-06-29 DIAGNOSIS — H578 Other specified disorders of eye and adnexa: Secondary | ICD-10-CM | POA: Diagnosis not present

## 2015-06-29 DIAGNOSIS — R3915 Urgency of urination: Secondary | ICD-10-CM

## 2015-06-29 DIAGNOSIS — H5789 Other specified disorders of eye and adnexa: Secondary | ICD-10-CM

## 2015-06-29 LAB — POC URINALSYSI DIPSTICK (AUTOMATED)
Bilirubin, UA: NEGATIVE
Blood, UA: NEGATIVE
Glucose, UA: NEGATIVE
Ketones, UA: NEGATIVE
Nitrite, UA: NEGATIVE
Protein, UA: NEGATIVE
Spec Grav, UA: 1.015
Urobilinogen, UA: 0.2
pH, UA: 7

## 2015-06-29 LAB — POCT UA - MICROSCOPIC ONLY
Casts, Ur, LPF, POC: 0
Epithelial cells, urine per micros: 0
RBC, urine, microscopic: 0
WBC, Ur, HPF, POC: 0

## 2015-06-29 MED ORDER — POLYMYXIN B-TRIMETHOPRIM 10000-0.1 UNIT/ML-% OP SOLN: OPHTHALMIC | Status: DC

## 2015-06-29 NOTE — Progress Notes (Signed)
   Subjective:    Patient ID: Amy Costa, female    DOB: [birth year], 35 y.o.   MRN: [id number]  HPI  35 year old female  Pt of Dr. Synthia Innocent presents with 2 acute issues.  1. Right eye red, matted, yellow discharge, irritation, blurred vision, watery eye, itcy, feels like something in eye, eye hurts.. Ongoing x 24 hours Put a new set of contacts in last Wednesday, took out yesterday.  No fever, mild congestion   2. Urinary frequency, no urgency, strong odor x few weeks.  no dysuria, no fever, no CVA tenderness.  HX of UTI, last one in last year. Social History /Family History/Past Medical History reviewed and updated if needed.    Review of Systems  Constitutional: Negative for fever and fatigue.  HENT: Negative for ear pain.   Eyes: Positive for photophobia, pain, discharge, redness and itching. Negative for visual disturbance.  Respiratory: Negative for chest tightness and shortness of breath.   Cardiovascular: Negative for chest pain, palpitations and leg swelling.  Gastrointestinal: Negative for abdominal pain.  Genitourinary: Negative for dysuria.       Objective:   Physical Exam  Constitutional: Vital signs are normal. She appears well-developed and well-nourished. She is cooperative.  Non-toxic appearance. She does not appear ill. No distress.  HENT:  Head: Normocephalic.  Right Ear: Hearing, tympanic membrane, external ear and ear canal normal. Tympanic membrane is not erythematous, not retracted and not bulging.  Left Ear: Hearing, tympanic membrane, external ear and ear canal normal. Tympanic membrane is not erythematous, not retracted and not bulging.  Nose: No mucosal edema or rhinorrhea. Right sinus exhibits no maxillary sinus tenderness and no frontal sinus tenderness. Left sinus exhibits no maxillary sinus tenderness and no frontal sinus tenderness.  Mouth/Throat: Uvula is midline, oropharynx is clear and moist and mucous membranes are normal.  Eyes: EOM are  normal. Pupils are equal, round, and reactive to light. Lids are everted and swept, no foreign bodies found. Left eye exhibits discharge. Left eye exhibits no exudate and no hordeolum. No foreign body present in the left eye. Left conjunctiva is injected. Left conjunctiva has no hemorrhage. No scleral icterus. Right eye exhibits normal extraocular motion and no nystagmus. Left eye exhibits normal extraocular motion and no nystagmus.  Neg fluorescein staining  Neck: Trachea normal and normal range of motion. Neck supple. Carotid bruit is not present. No thyroid mass and no thyromegaly present.  Cardiovascular: Normal rate, regular rhythm, S1 normal, S2 normal, normal heart sounds, intact distal pulses and normal pulses.  Exam reveals no gallop and no friction rub.   No murmur heard. Pulmonary/Chest: Effort normal and breath sounds normal. No tachypnea. No respiratory distress. She has no decreased breath sounds. She has no wheezes. She has no rhonchi. She has no rales.  Abdominal: Soft. Normal appearance and bowel sounds are normal. There is no tenderness. There is no CVA tenderness.  Neurological: She is alert.  Skin: Skin is warm, dry and intact. No rash noted.  Psychiatric: Her speech is normal and behavior is normal. Judgment and thought content normal. Her mood appears not anxious. Cognition and memory are normal. She does not exhibit a depressed mood.          Assessment & Plan:

## 2015-06-29 NOTE — Assessment & Plan Note (Addendum)
UA clear, avoid bladder irritants, push fluids.

## 2015-06-29 NOTE — Assessment & Plan Note (Signed)
Fluorescein stain negative for foreign body, no herpes infec, no abrasion seen.  Most likely viral infection.. Will start on antibiotics eye drops given wears contacts for prevention of bacterial infection.  Keep contacts out.  If not improiving in next 48-72 hours see opthalmologist for full eval.

## 2015-06-29 NOTE — Progress Notes (Signed)
Pre visit review using our clinic review tool, if applicable. No additional management support is needed unless otherwise documented below in the visit note. 

## 2015-06-29 NOTE — Patient Instructions (Addendum)
Avoid bladder irritants such as citrus, tomato, caffeine, peppermint, soda,spicy foods. Push fluids, water. Start eye drops.. If eye not improving in next 48 hours see eye MD for full eval.

## 2015-08-25 ENCOUNTER — Other Ambulatory Visit: Payer: Self-pay | Admitting: Family Medicine

## 2015-08-27 NOTE — Telephone Encounter (Signed)
Pt's physical was on 08/15/14, last refill was on 08/15/14. She  Didn't scheduled her annual physical as recommended. Ok refill?

## 2015-12-20 DIAGNOSIS — R202 Paresthesia of skin: Secondary | ICD-10-CM | POA: Diagnosis not present

## 2015-12-20 DIAGNOSIS — M79642 Pain in left hand: Secondary | ICD-10-CM | POA: Diagnosis not present

## 2015-12-20 DIAGNOSIS — M79641 Pain in right hand: Secondary | ICD-10-CM | POA: Diagnosis not present

## 2016-01-10 DIAGNOSIS — Z79891 Long term (current) use of opiate analgesic: Secondary | ICD-10-CM | POA: Diagnosis not present

## 2016-01-10 DIAGNOSIS — M79641 Pain in right hand: Secondary | ICD-10-CM | POA: Diagnosis not present

## 2016-01-10 DIAGNOSIS — M542 Cervicalgia: Secondary | ICD-10-CM | POA: Diagnosis not present

## 2016-01-10 DIAGNOSIS — R202 Paresthesia of skin: Secondary | ICD-10-CM | POA: Diagnosis not present

## 2016-01-23 DIAGNOSIS — N911 Secondary amenorrhea: Secondary | ICD-10-CM | POA: Diagnosis not present

## 2016-02-07 DIAGNOSIS — N911 Secondary amenorrhea: Secondary | ICD-10-CM | POA: Diagnosis not present

## 2016-02-13 DIAGNOSIS — Z3685 Encounter for antenatal screening for Streptococcus B: Secondary | ICD-10-CM | POA: Diagnosis not present

## 2016-02-13 DIAGNOSIS — Z3A08 8 weeks gestation of pregnancy: Secondary | ICD-10-CM | POA: Diagnosis not present

## 2016-02-13 DIAGNOSIS — Z3481 Encounter for supervision of other normal pregnancy, first trimester: Secondary | ICD-10-CM | POA: Diagnosis not present

## 2016-02-13 LAB — OB RESULTS CONSOLE ANTIBODY SCREEN: Antibody Screen: NEGATIVE

## 2016-02-13 LAB — OB RESULTS CONSOLE RPR: RPR: NONREACTIVE

## 2016-02-13 LAB — OB RESULTS CONSOLE HIV ANTIBODY (ROUTINE TESTING): HIV: NONREACTIVE

## 2016-02-13 LAB — OB RESULTS CONSOLE GC/CHLAMYDIA
Chlamydia: NEGATIVE
Gonorrhea: NEGATIVE

## 2016-02-13 LAB — OB RESULTS CONSOLE ABO/RH: RH Type: POSITIVE

## 2016-02-13 LAB — OB RESULTS CONSOLE HEPATITIS B SURFACE ANTIGEN: Hepatitis B Surface Ag: NEGATIVE

## 2016-02-13 LAB — OB RESULTS CONSOLE RUBELLA ANTIBODY, IGM: Rubella: NON-IMMUNE/NOT IMMUNE

## 2016-02-26 DIAGNOSIS — Z113 Encounter for screening for infections with a predominantly sexual mode of transmission: Secondary | ICD-10-CM | POA: Diagnosis not present

## 2016-02-26 DIAGNOSIS — Z3A10 10 weeks gestation of pregnancy: Secondary | ICD-10-CM | POA: Diagnosis not present

## 2016-02-26 DIAGNOSIS — O99211 Obesity complicating pregnancy, first trimester: Secondary | ICD-10-CM | POA: Diagnosis not present

## 2016-03-10 DIAGNOSIS — O09521 Supervision of elderly multigravida, first trimester: Secondary | ICD-10-CM | POA: Diagnosis not present

## 2016-03-10 DIAGNOSIS — Z3682 Encounter for antenatal screening for nuchal translucency: Secondary | ICD-10-CM | POA: Diagnosis not present

## 2016-03-26 DIAGNOSIS — R42 Dizziness and giddiness: Secondary | ICD-10-CM | POA: Diagnosis not present

## 2016-04-11 ENCOUNTER — Encounter (HOSPITAL_COMMUNITY): Payer: Self-pay | Admitting: *Deleted

## 2016-04-11 ENCOUNTER — Inpatient Hospital Stay (HOSPITAL_COMMUNITY)
Admission: AD | Admit: 2016-04-11 | Discharge: 2016-04-11 | Disposition: A | Discharge status: Home / Self Care | Payer: BLUE CROSS/BLUE SHIELD | Source: Ambulatory Visit | Attending: Obstetrics and Gynecology | Admitting: Obstetrics and Gynecology

## 2016-04-11 DIAGNOSIS — R350 Frequency of micturition: Secondary | ICD-10-CM | POA: Diagnosis not present

## 2016-04-11 DIAGNOSIS — R300 Dysuria: Secondary | ICD-10-CM | POA: Insufficient documentation

## 2016-04-11 DIAGNOSIS — R3915 Urgency of urination: Secondary | ICD-10-CM | POA: Diagnosis not present

## 2016-04-11 DIAGNOSIS — Z79899 Other long term (current) drug therapy: Secondary | ICD-10-CM | POA: Insufficient documentation

## 2016-04-11 DIAGNOSIS — Z3A17 17 weeks gestation of pregnancy: Secondary | ICD-10-CM | POA: Diagnosis not present

## 2016-04-11 DIAGNOSIS — F329 Major depressive disorder, single episode, unspecified: Secondary | ICD-10-CM | POA: Diagnosis not present

## 2016-04-11 DIAGNOSIS — O99342 Other mental disorders complicating pregnancy, second trimester: Secondary | ICD-10-CM | POA: Diagnosis not present

## 2016-04-11 DIAGNOSIS — O26892 Other specified pregnancy related conditions, second trimester: Secondary | ICD-10-CM | POA: Diagnosis not present

## 2016-04-11 DIAGNOSIS — O09522 Supervision of elderly multigravida, second trimester: Secondary | ICD-10-CM | POA: Insufficient documentation

## 2016-04-11 LAB — URINALYSIS, ROUTINE W REFLEX MICROSCOPIC
Bilirubin Urine: NEGATIVE
Glucose, UA: NEGATIVE mg/dL
Hgb urine dipstick: NEGATIVE
Ketones, ur: NEGATIVE mg/dL
Nitrite: NEGATIVE
Protein, ur: NEGATIVE mg/dL
Specific Gravity, Urine: 1.023 (ref 1.005–1.030)
pH: 5 (ref 5.0–8.0)

## 2016-04-11 MED ORDER — PHENAZOPYRIDINE HCL 200 MG PO TABS: 200.0000 mg | ORAL_TABLET | Freq: Three times a day (TID) | ORAL | 1 refills | Status: DC | PRN

## 2016-04-11 MED ORDER — NITROFURANTOIN MONOHYD MACRO 100 MG PO CAPS: 100.0000 mg | ORAL_CAPSULE | Freq: Two times a day (BID) | ORAL | 1 refills | Status: DC

## 2016-04-11 NOTE — Discharge Instructions (Signed)

## 2016-04-11 NOTE — MAU Note (Signed)
Pt presents to MAU with complaints of pain with urination, urine frequency that started last night. Denies any vaginal bleeding or abnormal discharge

## 2016-04-11 NOTE — MAU Provider Note (Signed)
Chief Complaint: Urinary Frequency   Provider saw patient at 1600 hrs      SUBJECTIVE HPI: Amy Costa is a 35 y.o. G2P0101 at [redacted]w[redacted]d by LMP who presents to maternity admissions reporting pain with urination, urinary frequency.  Started last night.  Denies seeing blood in urine or vaginal discharge.  She denies vaginal bleeding, vaginal itching/burning, h/a, dizziness, n/v, or fever/chills.     Urinary Frequency   This is a new problem. The current episode started yesterday. The problem occurs every urination. The problem has been unchanged. The quality of the pain is described as burning. The pain is mild. There has been no fever. There is no history of pyelonephritis. Associated symptoms include frequency. Pertinent negatives include no chills, flank pain, hematuria, nausea or vomiting. She has tried nothing for the symptoms.   RN Note: Pt presents to MAU with complaints of pain with urination, urine frequency that started last night. Denies any vaginal bleeding or abnormal discharge  Past Medical History:  Diagnosis Date  . DDD (degenerative disc disease), cervical 06/2010   C5/6, C6/7 disc protrusions with deformity of thecal sac and cervical cord  . Depression    on zoloft for years  . HPV (human papilloma virus) infection   . Hypertension   . Insomnia, unspecified   . Migraine    Dr Nelva Bush at [facility] ortho  . Pre-eclampsia    Past Surgical History:  Procedure Laterality Date  . ARM WOUND REPAIR / CLOSURE    . BACK SURGERY  07/2011 (last injection)   pt reports several cervical spine injections for degenerative disc  . CESAREAN SECTION  01/21/2012   Procedure: CESAREAN SECTION;  Surgeon: Margarette Asal, MD;  Location: [facility];  Service: Obstetrics;  Laterality: N/A;  . MRI  2010   [facility] - showing bulging disc C5/6  . MYOMECTOMY  01/21/2012   Procedure: MYOMECTOMY;  Surgeon: Margarette Asal, MD;  Location: [facility];  Service: Obstetrics;  Laterality: N/A;   Social History    Social History  . Marital status: Married    Spouse name: N/A  . Number of children: 0  . Years of education: N/A   Occupational History  . Customer Service Other    [place] History Main Topics  . Smoking status: Never Smoker  . Smokeless tobacco: Never Used  . Alcohol use Yes     Comment: Occasional  . Drug use: No  . Sexual activity: Yes    Birth control/ protection: None   Other Topics Concern  . Not on file   Social History Narrative   Lives with fiance and 2yo child, 1 dog   Occ: customer service at [employer]   Activity: walks some at work   Diet: good water, fruits/vegetables daily   No current facility-administered medications on file prior to encounter.    Current Outpatient Prescriptions on File Prior to Encounter  Medication Sig Dispense Refill  . acetaminophen (TYLENOL) 500 MG tablet Take 1,000 mg by mouth every 6 (six) hours as needed. pain    . citalopram (CELEXA) 20 MG tablet TAKE 1 TABLET (20 MG TOTAL) BY MOUTH DAILY. 30 tablet 3  . cyclobenzaprine (FLEXERIL) 10 MG tablet   0  . HYDROcodone-acetaminophen (NORCO/VICODIN) 5-325 MG per tablet Take 1 tablet by mouth every 6 (six) hours as needed for moderate pain. 20 tablet 0  . ibuprofen (ADVIL,MOTRIN) 800 MG tablet Take 1 tablet (800 mg total) by mouth every 8 (eight)  hours as needed. 30 tablet 0  . levonorgestrel (MIRENA) 20 MCG/24HR IUD 1 each by Intrauterine route once.    . naproxen sodium (ALEVE) 220 MG tablet Take 220 mg by mouth 2 (two) times daily with a meal.    . trimethoprim-polymyxin b (POLYTRIM) ophthalmic solution Apply in affected eye 1 drop every 6 hours for 5 days. 10 mL 0  . [DISCONTINUED] atenolol (TENORMIN) 100 MG tablet Take 1 tablet (100 mg total) by mouth daily. 90 tablet 3  . [DISCONTINUED] norethindrone-ethinyl estradiol-iron (LOESTRIN FE 1.5/30) 1.5-30 MG-MCG tablet Take 1 tablet by mouth daily.       No Known Allergies  I have reviewed patient's  Past Medical Hx, Surgical Hx, Family Hx, Social Hx, medications and allergies.   ROS:  Review of Systems  Constitutional: Negative for chills.  Gastrointestinal: Negative for nausea and vomiting.  Genitourinary: Positive for dysuria and frequency. Negative for difficulty urinating, flank pain, hematuria, vaginal bleeding and vaginal discharge.  Musculoskeletal: Negative for back pain.   Review of Systems  Other systems negative   Physical Exam  Physical Exam Patient Vitals for the past 24 hrs:  BP Temp Pulse Resp Height Weight  04/11/16 1423 166/83 98.8 F (37.1 C) 96 18 5\' 10"  (1.778 m) 270 lb (122.5 kg)   Constitutional: Well-developed, well-nourished female in no acute distress.  Cardiovascular: normal rate Respiratory: normal effort GI: Abd soft, non-tender. Pos BS x 4 MS: Extremities nontender, no edema, normal ROM Neurologic: Alert and oriented x 4.  GU: Neg CVAT.  FHTs 157  LAB RESULTS Results for orders placed or performed during the hospital encounter of 04/11/16 (from the past 24 hour(s))  Urinalysis, Routine w reflex microscopic     Status: Abnormal   Collection Time: 04/11/16  2:20 PM  Result Value Ref Range   Color, Urine YELLOW YELLOW   APPearance HAZY (A) CLEAR   Specific Gravity, Urine 1.023 1.005 - 1.030   pH 5.0 5.0 - 8.0   Glucose, UA NEGATIVE NEGATIVE mg/dL   Hgb urine dipstick NEGATIVE NEGATIVE   Bilirubin Urine NEGATIVE NEGATIVE   Ketones, ur NEGATIVE NEGATIVE mg/dL   Protein, ur NEGATIVE NEGATIVE mg/dL   Nitrite NEGATIVE NEGATIVE   Leukocytes, UA TRACE (A) NEGATIVE   RBC / HPF 0-5 0 - 5 RBC/hpf   WBC, UA 6-30 0 - 5 WBC/hpf   Bacteria, UA MANY (A) NONE SEEN   Squamous Epithelial / LPF 0-5 (A) NONE SEEN   Mucous PRESENT        IMAGING No results found.  MAU Management/MDM: Urine showed only trace leuk's but many bacteria So will send to culture and treat proactively with Macrobid and pyridium Will send urine for culture and will  followup in office  ASSESSMENT 1. Urinary urgency   2. Urinary frequency   3.     Possible UTI  PLAN Discharge home Urine to culture Will not Rx antibiotic for now but will Rx Pyridium for dysuria Follow up in office for recheck   Allergies as of 04/11/2016   No Known Allergies     Medication List    STOP taking these medications   ALEVE 220 MG tablet Generic drug:  naproxen sodium   ibuprofen 800 MG tablet Commonly known as:  ADVIL,MOTRIN   levonorgestrel 20 MCG/24HR IUD Commonly known as:  MIRENA   trimethoprim-polymyxin b ophthalmic solution Commonly known as:  POLYTRIM     TAKE these medications   acetaminophen 500 MG tablet Commonly known as:  TYLENOL Take 1,000 mg by mouth every 6 (six) hours as needed. pain   citalopram 20 MG tablet Commonly known as:  CELEXA TAKE 1 TABLET (20 MG TOTAL) BY MOUTH DAILY.   cyclobenzaprine 10 MG tablet Commonly known as:  FLEXERIL   HYDROcodone-acetaminophen 5-325 MG tablet Commonly known as:  NORCO/VICODIN Take 1 tablet by mouth every 6 (six) hours as needed for moderate pain.   nitrofurantoin (macrocrystal-monohydrate) 100 MG capsule Commonly known as:  MACROBID Take 1 capsule (100 mg total) by mouth 2 (two) times daily.   nitrofurantoin (macrocrystal-monohydrate) 100 MG capsule Commonly known as:  MACROBID Take 1 capsule (100 mg total) by mouth 2 (two) times daily.   phenazopyridine 200 MG tablet Commonly known as:  PYRIDIUM Take 1 tablet (200 mg total) by mouth 3 (three) times daily as needed for pain (urethral spasm).         Follow-up Information    LOWE,DAVID C, MD. Schedule an appointment as soon as possible for a visit.   Specialty:  Obstetrics and Gynecology Contact information: [address], [place] [address] [phone]          Pt stable at time of discharge. Encouraged to return here or to other Urgent Care/ED if she develops worsening of symptoms, increase in pain,  fever, or other concerning symptoms.    Hansel Feinstein CNM, MSN Certified Nurse-Midwife 04/11/2016  4:24 PM

## 2016-04-12 LAB — CULTURE, OB URINE: Culture: >=100000 — AB

## 2016-04-13 ENCOUNTER — Other Ambulatory Visit: Payer: Self-pay | Admitting: Student

## 2016-04-13 ENCOUNTER — Telehealth: Payer: Self-pay | Admitting: Student

## 2016-04-13 MED ORDER — NITROFURANTOIN MONOHYD MACRO 100 MG PO CAPS: 100.0000 mg | ORAL_CAPSULE | Freq: Two times a day (BID) | ORAL | 1 refills | Status: DC

## 2016-04-13 MED ORDER — AMOXICILLIN 875 MG PO TABS
875.0000 mg | ORAL_TABLET | Freq: Two times a day (BID) | ORAL | 0 refills | Status: DC
Start: 2016-04-13 — End: 2016-04-14

## 2016-04-14 ENCOUNTER — Other Ambulatory Visit: Payer: Self-pay | Admitting: Student

## 2016-04-14 MED ORDER — AMOXICILLIN 875 MG PO TABS: 875.0000 mg | ORAL_TABLET | Freq: Two times a day (BID) | ORAL | 0 refills | Status: DC

## 2016-04-22 DIAGNOSIS — Z363 Encounter for antenatal screening for malformations: Secondary | ICD-10-CM | POA: Diagnosis not present

## 2016-04-22 DIAGNOSIS — Z3A18 18 weeks gestation of pregnancy: Secondary | ICD-10-CM | POA: Diagnosis not present

## 2016-04-25 ENCOUNTER — Encounter (HOSPITAL_COMMUNITY): Payer: Self-pay | Admitting: Advanced Practice Midwife

## 2016-04-25 DIAGNOSIS — R8271 Bacteriuria: Secondary | ICD-10-CM | POA: Insufficient documentation

## 2016-05-20 DIAGNOSIS — H5213 Myopia, bilateral: Secondary | ICD-10-CM | POA: Diagnosis not present

## 2016-05-21 DIAGNOSIS — Z362 Encounter for other antenatal screening follow-up: Secondary | ICD-10-CM | POA: Diagnosis not present

## 2016-05-28 DIAGNOSIS — H1045 Other chronic allergic conjunctivitis: Secondary | ICD-10-CM | POA: Diagnosis not present

## 2016-06-16 DIAGNOSIS — Z3480 Encounter for supervision of other normal pregnancy, unspecified trimester: Secondary | ICD-10-CM | POA: Diagnosis not present

## 2016-06-16 DIAGNOSIS — Z36 Encounter for antenatal screening for chromosomal anomalies: Secondary | ICD-10-CM | POA: Diagnosis not present

## 2016-06-16 DIAGNOSIS — Z362 Encounter for other antenatal screening follow-up: Secondary | ICD-10-CM | POA: Diagnosis not present

## 2016-06-16 DIAGNOSIS — Z3A26 26 weeks gestation of pregnancy: Secondary | ICD-10-CM | POA: Diagnosis not present

## 2016-06-23 DIAGNOSIS — O99810 Abnormal glucose complicating pregnancy: Secondary | ICD-10-CM | POA: Diagnosis not present

## 2016-06-23 DIAGNOSIS — Z3A29 29 weeks gestation of pregnancy: Secondary | ICD-10-CM | POA: Diagnosis not present

## 2016-07-02 DIAGNOSIS — Z3A30 30 weeks gestation of pregnancy: Secondary | ICD-10-CM | POA: Diagnosis not present

## 2016-07-02 DIAGNOSIS — O133 Gestational [pregnancy-induced] hypertension without significant proteinuria, third trimester: Secondary | ICD-10-CM | POA: Diagnosis not present

## 2016-07-02 DIAGNOSIS — Z362 Encounter for other antenatal screening follow-up: Secondary | ICD-10-CM | POA: Diagnosis not present

## 2016-07-09 DIAGNOSIS — O4103X0 Oligohydramnios, third trimester, not applicable or unspecified: Secondary | ICD-10-CM | POA: Diagnosis not present

## 2016-07-09 DIAGNOSIS — Z3A30 30 weeks gestation of pregnancy: Secondary | ICD-10-CM | POA: Diagnosis not present

## 2016-07-23 DIAGNOSIS — O10013 Pre-existing essential hypertension complicating pregnancy, third trimester: Secondary | ICD-10-CM | POA: Diagnosis not present

## 2016-07-23 DIAGNOSIS — Z3A32 32 weeks gestation of pregnancy: Secondary | ICD-10-CM | POA: Diagnosis not present

## 2016-07-30 DIAGNOSIS — Z3A33 33 weeks gestation of pregnancy: Secondary | ICD-10-CM | POA: Diagnosis not present

## 2016-07-30 DIAGNOSIS — O133 Gestational [pregnancy-induced] hypertension without significant proteinuria, third trimester: Secondary | ICD-10-CM | POA: Diagnosis not present

## 2016-08-05 DIAGNOSIS — O10013 Pre-existing essential hypertension complicating pregnancy, third trimester: Secondary | ICD-10-CM | POA: Diagnosis not present

## 2016-08-05 DIAGNOSIS — Z3A33 33 weeks gestation of pregnancy: Secondary | ICD-10-CM | POA: Diagnosis not present

## 2016-08-13 DIAGNOSIS — O10013 Pre-existing essential hypertension complicating pregnancy, third trimester: Secondary | ICD-10-CM | POA: Diagnosis not present

## 2016-08-13 DIAGNOSIS — Z3A35 35 weeks gestation of pregnancy: Secondary | ICD-10-CM | POA: Diagnosis not present

## 2016-08-19 NOTE — H&P (Signed)
Amy Costa  DICTATION # [id number] CSN# [id number]   Margarette Asal, MD 08/19/2016 2:25 PM

## 2016-08-20 DIAGNOSIS — Z3A36 36 weeks gestation of pregnancy: Secondary | ICD-10-CM | POA: Diagnosis not present

## 2016-08-20 DIAGNOSIS — Z3480 Encounter for supervision of other normal pregnancy, unspecified trimester: Secondary | ICD-10-CM | POA: Diagnosis not present

## 2016-08-20 DIAGNOSIS — O10013 Pre-existing essential hypertension complicating pregnancy, third trimester: Secondary | ICD-10-CM | POA: Diagnosis not present

## 2016-08-20 DIAGNOSIS — Z3685 Encounter for antenatal screening for Streptococcus B: Secondary | ICD-10-CM | POA: Diagnosis not present

## 2016-08-20 NOTE — H&P (Signed)
NAME:  Amy Costa                  ACCOUNT NO.:  MEDICAL RECORD NO.:  LOCATION:                                 FACILITY:  PHYSICIAN:  Janene Yousuf M. Matthew Saras, M.D.    DATE OF BIRTH:  DATE OF ADMISSION: DATE OF DISCHARGE:                             HISTORY & PHYSICAL   CHIEF COMPLAINT:  For repeat cesarean section and tubal ligation at term.  HISTORY OF PRESENT ILLNESS:  A 35 year old, G2, P0-1-0-1 at 39 weeks, presents for repeat cesarean section and tubal ligation.  The permanence of the procedure, failure rate of 2-3 per 1000 reviewed with her. Specifics related to C-section risks including risk of bleeding, infection, transfusion, adjacent organ injury, wound infection, phlebitis along with her expected recovery time reviewed with her, which she understands and accepts.  PRENATAL COURSE:  She had a normal panorama early in the pregnancy, has been on a baby aspirin once daily because of a history of previous preeclampsia.  Her 1-hour GTT was 139 with a followup 3-hour GTT that was normal.  She has had borderline hypertension, but does not require medications with this pregnancy.  PAST MEDICAL HISTORY:  Allergies:  None.  Prior surgery:  In 2013, cesarean section at 32 weeks for severe PIH with oligohydramnios.  For the remainder of her family and social history, please see the [facility] form for details.  PHYSICAL EXAMINATION:  VITAL SIGNS:  Temp 98.2, blood pressure 140/82. HEENT:  Unremarkable. NECK:  Supple without masses. LUNGS:  Clear. CARDIOVASCULAR:  Regular rate and rhythm without murmurs, rubs, or gallops noted. BREASTS:  Without masses. ABDOMEN:  Term fundal height.  Fetal heart rate 140.  Cervix closed. EXTREMITIES:  Unremarkable. NEUROLOGIC:  Unremarkable.  IMPRESSION:  Term pregnancy for repeat cesarean section and tubal ligation.  Procedure and risks reviewed as above.    Amy Costa M. Matthew Saras, M.D.    [facility]/MEDQ  D:  08/19/2016  T:  08/20/2016   Job:  [id number]

## 2016-08-27 LAB — OB RESULTS CONSOLE GBS: GBS: POSITIVE

## 2016-08-28 DIAGNOSIS — O9989 Other specified diseases and conditions complicating pregnancy, childbirth and the puerperium: Secondary | ICD-10-CM | POA: Diagnosis not present

## 2016-09-03 ENCOUNTER — Encounter (HOSPITAL_COMMUNITY): Payer: Self-pay

## 2016-09-04 DIAGNOSIS — O10013 Pre-existing essential hypertension complicating pregnancy, third trimester: Secondary | ICD-10-CM | POA: Diagnosis not present

## 2016-09-04 DIAGNOSIS — Z3A38 38 weeks gestation of pregnancy: Secondary | ICD-10-CM | POA: Diagnosis not present

## 2016-09-08 ENCOUNTER — Encounter (HOSPITAL_COMMUNITY)
Admission: RE | Admit: 2016-09-08 | Discharge: 2016-09-08 | Disposition: A | Discharge status: Home / Self Care | Payer: BLUE CROSS/BLUE SHIELD | Source: Ambulatory Visit | Attending: Obstetrics and Gynecology | Admitting: Obstetrics and Gynecology

## 2016-09-08 DIAGNOSIS — Z3A39 39 weeks gestation of pregnancy: Secondary | ICD-10-CM | POA: Diagnosis not present

## 2016-09-08 DIAGNOSIS — Z302 Encounter for sterilization: Secondary | ICD-10-CM | POA: Diagnosis not present

## 2016-09-08 DIAGNOSIS — O134 Gestational [pregnancy-induced] hypertension without significant proteinuria, complicating childbirth: Secondary | ICD-10-CM | POA: Diagnosis not present

## 2016-09-08 DIAGNOSIS — Z3A00 Weeks of gestation of pregnancy not specified: Secondary | ICD-10-CM | POA: Diagnosis not present

## 2016-09-08 DIAGNOSIS — Z23 Encounter for immunization: Secondary | ICD-10-CM | POA: Diagnosis not present

## 2016-09-08 DIAGNOSIS — O34211 Maternal care for low transverse scar from previous cesarean delivery: Secondary | ICD-10-CM | POA: Diagnosis not present

## 2016-09-08 DIAGNOSIS — O99214 Obesity complicating childbirth: Secondary | ICD-10-CM | POA: Diagnosis not present

## 2016-09-08 DIAGNOSIS — Z6841 Body Mass Index (BMI) 40.0 and over, adult: Secondary | ICD-10-CM | POA: Diagnosis not present

## 2016-09-08 HISTORY — DX: Anxiety disorder, unspecified: F41.9

## 2016-09-08 LAB — CBC
HCT: 37 % (ref 36.0–46.0)
Hemoglobin: 12 g/dL (ref 12.0–15.0)
MCH: 25.5 pg — ABNORMAL LOW (ref 26.0–34.0)
MCHC: 32.4 g/dL (ref 30.0–36.0)
MCV: 78.7 fL (ref 78.0–100.0)
Platelets: 187 10*3/uL (ref 150–400)
RBC: 4.7 MIL/uL (ref 3.87–5.11)
RDW: 14.6 % (ref 11.5–15.5)
WBC: 8.3 10*3/uL (ref 4.0–10.5)

## 2016-09-08 LAB — TYPE AND SCREEN
ABO/RH(D): O POS
Antibody Screen: NEGATIVE

## 2016-09-08 NOTE — Patient Instructions (Signed)
[place]  09/08/2016   Your procedure is scheduled on:  09/09/2016  Enter through the Main Entrance of [facility] at [place] up the phone at the desk and dial [phone].   Call this number if you have problems the morning of surgery: [phone]   Remember:   Do not eat food:After Midnight.  Do not drink clear liquids: After Midnight.  Take these medicines the morning of surgery with A SIP OF WATER: none   Do not wear jewelry, make-up or nail polish.  Do not wear lotions, powders, or perfumes. Do not wear deodorant.  Do not shave 48 hours prior to surgery.  Do not bring valuables to the hospital.  [facility] is not   responsible for any belongings or valuables brought to the hospital.  Contacts, dentures or bridgework may not be worn into surgery.  Leave suitcase in the car. After surgery it may be brought to your room.  For patients admitted to the hospital, checkout time is 11:00 AM the day of              discharge.   Patients discharged the day of surgery will not be allowed to drive             home.  Name and phone number of your driver: na  Special Instructions:   N/A   Please read over the following fact sheets that you were given:   Surgical Site Infection Prevention

## 2016-09-09 ENCOUNTER — Inpatient Hospital Stay (HOSPITAL_COMMUNITY)
Admission: RE | Admit: 2016-09-09 | Discharge: 2016-09-12 | DRG: 765 | Disposition: A | Discharge status: Home / Self Care | Payer: BLUE CROSS/BLUE SHIELD | Source: Ambulatory Visit | Attending: Obstetrics and Gynecology | Admitting: Obstetrics and Gynecology

## 2016-09-09 ENCOUNTER — Encounter (HOSPITAL_COMMUNITY): Payer: Self-pay | Admitting: *Deleted

## 2016-09-09 ENCOUNTER — Encounter (HOSPITAL_COMMUNITY)
Admission: RE | Disposition: A | Discharge status: Home / Self Care | Payer: Self-pay | Source: Ambulatory Visit | Attending: Obstetrics and Gynecology

## 2016-09-09 ENCOUNTER — Inpatient Hospital Stay (HOSPITAL_COMMUNITY): Payer: BLUE CROSS/BLUE SHIELD

## 2016-09-09 DIAGNOSIS — O99214 Obesity complicating childbirth: Secondary | ICD-10-CM | POA: Diagnosis present

## 2016-09-09 DIAGNOSIS — Z6841 Body Mass Index (BMI) 40.0 and over, adult: Secondary | ICD-10-CM

## 2016-09-09 DIAGNOSIS — Z302 Encounter for sterilization: Secondary | ICD-10-CM

## 2016-09-09 DIAGNOSIS — Z3A00 Weeks of gestation of pregnancy not specified: Secondary | ICD-10-CM | POA: Diagnosis not present

## 2016-09-09 DIAGNOSIS — O34211 Maternal care for low transverse scar from previous cesarean delivery: Principal | ICD-10-CM | POA: Diagnosis present

## 2016-09-09 DIAGNOSIS — Z3A39 39 weeks gestation of pregnancy: Secondary | ICD-10-CM | POA: Diagnosis not present

## 2016-09-09 DIAGNOSIS — O134 Gestational [pregnancy-induced] hypertension without significant proteinuria, complicating childbirth: Secondary | ICD-10-CM | POA: Diagnosis not present

## 2016-09-09 DIAGNOSIS — Z3490 Encounter for supervision of normal pregnancy, unspecified, unspecified trimester: Secondary | ICD-10-CM

## 2016-09-09 LAB — CBC
HCT: 37.8 % (ref 36.0–46.0)
Hemoglobin: 12.2 g/dL (ref 12.0–15.0)
MCH: 25.4 pg — ABNORMAL LOW (ref 26.0–34.0)
MCHC: 32.3 g/dL (ref 30.0–36.0)
MCV: 78.8 fL (ref 78.0–100.0)
Platelets: 197 10*3/uL (ref 150–400)
RBC: 4.8 MIL/uL (ref 3.87–5.11)
RDW: 14.6 % (ref 11.5–15.5)
WBC: 8.8 10*3/uL (ref 4.0–10.5)

## 2016-09-09 LAB — RPR: RPR Ser Ql: NONREACTIVE

## 2016-09-09 SURGERY — Surgical Case
Anesthesia: Spinal | Site: Abdomen | Laterality: Bilateral | Wound class: Clean Contaminated

## 2016-09-09 MED ORDER — MEASLES, MUMPS & RUBELLA VAC ~~LOC~~ INJ: 0.5000 mL | INJECTION | Freq: Once | SUBCUTANEOUS | Status: DC

## 2016-09-09 MED ORDER — PHENYLEPHRINE 40 MCG/ML (10ML) SYRINGE FOR IV PUSH (FOR BLOOD PRESSURE SUPPORT)
PREFILLED_SYRINGE | INTRAVENOUS | Status: AC
  Filled 2016-09-09: qty 10

## 2016-09-09 MED ORDER — MEPERIDINE HCL 25 MG/ML IJ SOLN: 6.2500 mg | INTRAMUSCULAR | Status: DC | PRN

## 2016-09-09 MED ORDER — SODIUM CHLORIDE 0.9% FLUSH: 3.0000 mL | INTRAVENOUS | Status: DC | PRN

## 2016-09-09 MED ORDER — PRENATAL MULTIVITAMIN CH
1.0000 | ORAL_TABLET | Freq: Every day | ORAL | Status: DC
  Filled 2016-09-09 (×3): qty 1

## 2016-09-09 MED ORDER — DIPHENHYDRAMINE HCL 25 MG PO CAPS
25.0000 mg | ORAL_CAPSULE | ORAL | Status: DC | PRN
  Filled 2016-09-09: qty 1

## 2016-09-09 MED ORDER — DIBUCAINE 1 % RE OINT: 1.0000 "application " | TOPICAL_OINTMENT | RECTAL | Status: DC | PRN

## 2016-09-09 MED ORDER — NALBUPHINE HCL 10 MG/ML IJ SOLN: 5.0000 mg | INTRAMUSCULAR | Status: DC | PRN

## 2016-09-09 MED ORDER — DEXTROSE 5 % IV SOLN
2.0000 g | INTRAVENOUS | Status: DC
  Filled 2016-09-09: qty 2

## 2016-09-09 MED ORDER — SODIUM CHLORIDE 0.9 % IR SOLN
Status: DC | PRN
  Administered 2016-09-09: 1

## 2016-09-09 MED ORDER — SIMETHICONE 80 MG PO CHEW
80.0000 mg | CHEWABLE_TABLET | Freq: Three times a day (TID) | ORAL | Status: DC
  Administered 2016-09-09 – 2016-09-12 (×6): 80 mg via ORAL
  Filled 2016-09-09 (×7): qty 1

## 2016-09-09 MED ORDER — COCONUT OIL OIL: 1.0000 "application " | TOPICAL_OIL | Status: DC | PRN

## 2016-09-09 MED ORDER — BUPIVACAINE IN DEXTROSE 0.75-8.25 % IT SOLN
INTRATHECAL | Status: DC | PRN
  Administered 2016-09-09: 1.6 mL via INTRATHECAL

## 2016-09-09 MED ORDER — NALOXONE HCL 2 MG/2ML IJ SOSY
1.0000 ug/kg/h | PREFILLED_SYRINGE | INTRAVENOUS | Status: DC | PRN
  Filled 2016-09-09: qty 2

## 2016-09-09 MED ORDER — SCOPOLAMINE 1 MG/3DAYS TD PT72: 1.0000 | MEDICATED_PATCH | Freq: Once | TRANSDERMAL | Status: DC

## 2016-09-09 MED ORDER — MENTHOL 3 MG MT LOZG: 1.0000 | LOZENGE | OROMUCOSAL | Status: DC | PRN

## 2016-09-09 MED ORDER — OXYTOCIN 40 UNITS IN LACTATED RINGERS INFUSION - SIMPLE MED: 2.5000 [IU]/h | INTRAVENOUS | Status: AC

## 2016-09-09 MED ORDER — SENNOSIDES-DOCUSATE SODIUM 8.6-50 MG PO TABS
2.0000 | ORAL_TABLET | ORAL | Status: DC
  Administered 2016-09-09 – 2016-09-11 (×3): 2 via ORAL
  Filled 2016-09-09 (×3): qty 2

## 2016-09-09 MED ORDER — PHENYLEPHRINE HCL 10 MG/ML IJ SOLN
INTRAMUSCULAR | Status: DC | PRN
  Administered 2016-09-09 (×2): 80 ug via INTRAVENOUS

## 2016-09-09 MED ORDER — CITALOPRAM HYDROBROMIDE 20 MG PO TABS
20.0000 mg | ORAL_TABLET | Freq: Every day | ORAL | Status: DC
  Filled 2016-09-09 (×3): qty 1

## 2016-09-09 MED ORDER — FLEET ENEMA 7-19 GM/118ML RE ENEM: 1.0000 | ENEMA | Freq: Every day | RECTAL | Status: DC | PRN

## 2016-09-09 MED ORDER — SODIUM CHLORIDE 0.9 % IV SOLN: 250.0000 mL | INTRAVENOUS | Status: DC

## 2016-09-09 MED ORDER — OXYTOCIN 10 UNIT/ML IJ SOLN
INTRAMUSCULAR | Status: AC
  Filled 2016-09-09: qty 4

## 2016-09-09 MED ORDER — LACTATED RINGERS IV SOLN
INTRAVENOUS | Status: DC | PRN
  Administered 2016-09-09 (×2): via INTRAVENOUS

## 2016-09-09 MED ORDER — DEXTROSE IN LACTATED RINGERS 5 % IV SOLN: INTRAVENOUS | Status: DC

## 2016-09-09 MED ORDER — NALOXONE HCL 0.4 MG/ML IJ SOLN: 0.4000 mg | INTRAMUSCULAR | Status: DC | PRN

## 2016-09-09 MED ORDER — ONDANSETRON HCL 4 MG/2ML IJ SOLN: 4.0000 mg | Freq: Three times a day (TID) | INTRAMUSCULAR | Status: DC | PRN

## 2016-09-09 MED ORDER — OXYCODONE-ACETAMINOPHEN 5-325 MG PO TABS
2.0000 | ORAL_TABLET | ORAL | Status: DC | PRN
  Administered 2016-09-10 (×3): 2 via ORAL
  Filled 2016-09-09 (×4): qty 2

## 2016-09-09 MED ORDER — KETOROLAC TROMETHAMINE 30 MG/ML IJ SOLN
30.0000 mg | Freq: Four times a day (QID) | INTRAMUSCULAR | Status: DC | PRN
  Administered 2016-09-09: 30 mg via INTRAMUSCULAR

## 2016-09-09 MED ORDER — NALBUPHINE HCL 10 MG/ML IJ SOLN: 5.0000 mg | Freq: Once | INTRAMUSCULAR | Status: DC | PRN

## 2016-09-09 MED ORDER — ONDANSETRON HCL 4 MG/2ML IJ SOLN
INTRAMUSCULAR | Status: DC | PRN
  Administered 2016-09-09: 4 mg via INTRAVENOUS

## 2016-09-09 MED ORDER — DEXTROSE IN LACTATED RINGERS 5 % IV SOLN
INTRAVENOUS | Status: DC
  Administered 2016-09-09 – 2016-09-10 (×2): via INTRAVENOUS

## 2016-09-09 MED ORDER — SIMETHICONE 80 MG PO CHEW
80.0000 mg | CHEWABLE_TABLET | ORAL | Status: DC
  Administered 2016-09-09 – 2016-09-11 (×3): 80 mg via ORAL
  Filled 2016-09-09 (×3): qty 1

## 2016-09-09 MED ORDER — CEFAZOLIN SODIUM 1 G IJ SOLR
INTRAMUSCULAR | Status: DC | PRN
  Administered 2016-09-09: 3 g via INTRAMUSCULAR

## 2016-09-09 MED ORDER — PHENYLEPHRINE 8 MG IN D5W 100 ML (0.08MG/ML) PREMIX OPTIME
INJECTION | INTRAVENOUS | Status: DC | PRN
  Administered 2016-09-09: 60 ug/min via INTRAVENOUS

## 2016-09-09 MED ORDER — KETOROLAC TROMETHAMINE 30 MG/ML IJ SOLN: 30.0000 mg | Freq: Four times a day (QID) | INTRAMUSCULAR | Status: DC | PRN

## 2016-09-09 MED ORDER — FENTANYL CITRATE (PF) 100 MCG/2ML IJ SOLN
INTRAMUSCULAR | Status: AC
  Filled 2016-09-09: qty 2

## 2016-09-09 MED ORDER — OXYTOCIN 10 UNIT/ML IJ SOLN
INTRAVENOUS | Status: DC | PRN
  Administered 2016-09-09: 40 [IU] via INTRAVENOUS

## 2016-09-09 MED ORDER — ZOLPIDEM TARTRATE 5 MG PO TABS: 5.0000 mg | ORAL_TABLET | Freq: Every evening | ORAL | Status: DC | PRN

## 2016-09-09 MED ORDER — MORPHINE SULFATE (PF) 0.5 MG/ML IJ SOLN
INTRAMUSCULAR | Status: DC | PRN
  Administered 2016-09-09: .2 mg via INTRATHECAL

## 2016-09-09 MED ORDER — DIPHENHYDRAMINE HCL 25 MG PO CAPS
25.0000 mg | ORAL_CAPSULE | Freq: Four times a day (QID) | ORAL | Status: DC | PRN
  Administered 2016-09-09: 25 mg via ORAL
  Filled 2016-09-09: qty 1

## 2016-09-09 MED ORDER — SODIUM CHLORIDE 0.9% FLUSH: 3.0000 mL | Freq: Two times a day (BID) | INTRAVENOUS | Status: DC

## 2016-09-09 MED ORDER — DIPHENHYDRAMINE HCL 50 MG/ML IJ SOLN: 12.5000 mg | INTRAMUSCULAR | Status: DC | PRN

## 2016-09-09 MED ORDER — MORPHINE SULFATE (PF) 0.5 MG/ML IJ SOLN
INTRAMUSCULAR | Status: AC
  Filled 2016-09-09: qty 10

## 2016-09-09 MED ORDER — KETOROLAC TROMETHAMINE 30 MG/ML IJ SOLN
INTRAMUSCULAR | Status: AC
  Filled 2016-09-09: qty 1

## 2016-09-09 MED ORDER — SCOPOLAMINE 1 MG/3DAYS TD PT72
1.0000 | MEDICATED_PATCH | TRANSDERMAL | Status: DC
  Administered 2016-09-09: 1.5 mg via TRANSDERMAL
  Filled 2016-09-09: qty 1

## 2016-09-09 MED ORDER — TETANUS-DIPHTH-ACELL PERTUSSIS 5-2.5-18.5 LF-MCG/0.5 IM SUSP
0.5000 mL | Freq: Once | INTRAMUSCULAR | Status: AC
  Administered 2016-09-11: 0.5 mL via INTRAMUSCULAR
  Filled 2016-09-09: qty 0.5

## 2016-09-09 MED ORDER — FENTANYL CITRATE (PF) 100 MCG/2ML IJ SOLN
25.0000 ug | INTRAMUSCULAR | Status: DC | PRN
  Administered 2016-09-09: 50 ug via INTRAVENOUS

## 2016-09-09 MED ORDER — WITCH HAZEL-GLYCERIN EX PADS: 1.0000 "application " | MEDICATED_PAD | CUTANEOUS | Status: DC | PRN

## 2016-09-09 MED ORDER — SIMETHICONE 80 MG PO CHEW: 80.0000 mg | CHEWABLE_TABLET | ORAL | Status: DC | PRN

## 2016-09-09 MED ORDER — SOD CITRATE-CITRIC ACID 500-334 MG/5ML PO SOLN
30.0000 mL | Freq: Once | ORAL | Status: AC
  Administered 2016-09-09: 30 mL via ORAL
  Filled 2016-09-09: qty 15

## 2016-09-09 MED ORDER — ACETAMINOPHEN 325 MG PO TABS: 650.0000 mg | ORAL_TABLET | ORAL | Status: DC | PRN

## 2016-09-09 MED ORDER — PHENYLEPHRINE 8 MG IN D5W 100 ML (0.08MG/ML) PREMIX OPTIME
INJECTION | INTRAVENOUS | Status: AC
  Filled 2016-09-09: qty 100

## 2016-09-09 MED ORDER — IBUPROFEN 800 MG PO TABS
800.0000 mg | ORAL_TABLET | Freq: Three times a day (TID) | ORAL | Status: DC | PRN
  Administered 2016-09-10 – 2016-09-12 (×6): 800 mg via ORAL
  Filled 2016-09-09 (×7): qty 1

## 2016-09-09 MED ORDER — ONDANSETRON HCL 4 MG/2ML IJ SOLN
INTRAMUSCULAR | Status: AC
  Filled 2016-09-09: qty 2

## 2016-09-09 MED ORDER — OXYCODONE-ACETAMINOPHEN 5-325 MG PO TABS
1.0000 | ORAL_TABLET | ORAL | Status: DC | PRN
  Administered 2016-09-09 – 2016-09-12 (×7): 1 via ORAL
  Filled 2016-09-09 (×7): qty 1

## 2016-09-09 MED ORDER — BISACODYL 10 MG RE SUPP: 10.0000 mg | Freq: Every day | RECTAL | Status: DC | PRN

## 2016-09-09 MED ORDER — FENTANYL CITRATE (PF) 100 MCG/2ML IJ SOLN
INTRAMUSCULAR | Status: DC | PRN
  Administered 2016-09-09: 50 ug via INTRAVENOUS
  Administered 2016-09-09: 10 ug via INTRATHECAL
  Administered 2016-09-09: 40 ug via INTRAVENOUS

## 2016-09-09 SURGICAL SUPPLY — 32 items
BENZOIN TINCTURE PRP APPL 2/3 (GAUZE/BANDAGES/DRESSINGS) ×2 IMPLANT
CHLORAPREP W/TINT 26ML (MISCELLANEOUS) ×2 IMPLANT
CLAMP CORD UMBIL (MISCELLANEOUS) IMPLANT
CLIP FILSHIE TUBAL LIGA STRL (Clip) ×2 IMPLANT
CLOSURE STERI STRIP 1/2 X4 (GAUZE/BANDAGES/DRESSINGS) ×2 IMPLANT
CLOTH BEACON ORANGE TIMEOUT ST (SAFETY) ×2 IMPLANT
DRSG OPSITE POSTOP 4X10 (GAUZE/BANDAGES/DRESSINGS) ×2 IMPLANT
ELECT REM PT RETURN 9FT ADLT (ELECTROSURGICAL) ×2
ELECTRODE REM PT RTRN 9FT ADLT (ELECTROSURGICAL) ×1 IMPLANT
EXTRACTOR VACUUM M CUP 4 TUBE (SUCTIONS) IMPLANT
GAUZE SPONGE 4X4 12PLY STRL LF (GAUZE/BANDAGES/DRESSINGS) ×4 IMPLANT
GLOVE BIO SURGEON STRL SZ7 (GLOVE) ×2 IMPLANT
GLOVE BIOGEL PI IND STRL 7.0 (GLOVE) ×2 IMPLANT
GLOVE BIOGEL PI INDICATOR 7.0 (GLOVE) ×2
GOWN STRL REUS W/TWL LRG LVL3 (GOWN DISPOSABLE) ×4 IMPLANT
KIT ABG SYR 3ML LUER SLIP (SYRINGE) IMPLANT
NEEDLE HYPO 25X5/8 SAFETYGLIDE (NEEDLE) ×2 IMPLANT
NS IRRIG 1000ML POUR BTL (IV SOLUTION) ×2 IMPLANT
PACK C SECTION WH (CUSTOM PROCEDURE TRAY) ×2 IMPLANT
PAD ABD 7.5X8 STRL (GAUZE/BANDAGES/DRESSINGS) ×2 IMPLANT
PAD OB MATERNITY 4.3X12.25 (PERSONAL CARE ITEMS) ×2 IMPLANT
PENCIL SMOKE EVAC W/HOLSTER (ELECTROSURGICAL) ×2 IMPLANT
STRIP CLOSURE SKIN 1/2X4 (GAUZE/BANDAGES/DRESSINGS) ×2 IMPLANT
SUT CHROMIC 0 CTX 36 (SUTURE) ×6 IMPLANT
SUT MON AB 4-0 PS1 27 (SUTURE) ×2 IMPLANT
SUT PDS AB 0 CT1 27 (SUTURE) ×4 IMPLANT
SUT PDS AB 0 CTX 60 (SUTURE) ×2 IMPLANT
SUT VIC AB 2-0 CT1 (SUTURE) ×2 IMPLANT
SUT VIC AB 3-0 CT1 27 (SUTURE) ×2
SUT VIC AB 3-0 CT1 TAPERPNT 27 (SUTURE) ×2 IMPLANT
TOWEL OR 17X24 6PK STRL BLUE (TOWEL DISPOSABLE) ×2 IMPLANT
TRAY FOLEY BAG SILVER LF 14FR (SET/KITS/TRAYS/PACK) ×2 IMPLANT

## 2016-09-09 NOTE — Op Note (Signed)
Preoperative diagnosis: Term pregnancy, previous cesarean section, request permanent sterilization, history of chronic hypertension  Postoperative diagnosis: Same  Procedure: Repeat low transverse cesarean section, tubal ligation with Filshie clips  Surgeon: Matthew Saras  Anesthesia: Spinal  EBL: 700 cc  Procedure and findings:  Patient taken the operating room after an adequate level of spinal anesthetic was obtained, Foley catheter was position she was prepped and draped in appropriate timeouts were taken. Transverse incision made through the old scar which is well-healed this is carried down the fascia which was incised and extended transversely. Rectus muscles divided in the midline, peritoneum entered superiorly without incident and extended vertically. The vesicouterine serosa was incised and the bladder was bluntly and sharply dissected below, bladder blade was repositioned. Transverse incision made lower segment extended with bandage scissors clear fluid noted the infant was in straight OP presentation was partially rotated and then a VE was applied to effect easy delivery of the vertex, infant suctioned cord clamped and passed the pediatric team for further care. The placenta was then removed manually intact, uterus exteriorized, cavity wiped clean with laparotomy pack closure obtained the first layer of 0 chromic in a locked fashion followed by number K layer of 0 chromic. This was hemostatic bladder flap area was intact and hemostatic bilateral tubes and ovaries normal. Filshie clip was applied a right angle on the right tube using a Babcock to place it on tension 2 cm from the cornu with excellent application after positively identifying the tube the exact same repeated on the opposite side prior to closure sponge, needle, history counts reported as correct 2. Peritoneum closed with 3-0 Vicryl suture the same used to reapproximate rectus muscles in the midline a double looped 0 PDS suture was  then used to close the fascia subcutaneous tissue was irrigated noted be hemostatic the subcutaneous closed with a 3-0 Vicryl running suture. 4-0 Monocryl subcuticular skin closure with a pressure dressing she tolerated this well went to recovery room in good condition.  Dictated with [place].

## 2016-09-09 NOTE — Progress Notes (Signed)
The patient was re-examined with no change in status 

## 2016-09-09 NOTE — Anesthesia Preprocedure Evaluation (Addendum)
Anesthesia Evaluation  Patient identified by MRN, date of birth, ID band Patient awake    Reviewed: Allergy & Precautions, NPO status , Patient's Chart, lab work & pertinent test results  Airway Mallampati: II  TM Distance: >3 FB Neck ROM: Full    Dental  (+) Dental Advisory Given   Pulmonary neg pulmonary ROS,    breath sounds clear to auscultation       Cardiovascular hypertension,  Rhythm:Regular Rate:Normal     Neuro/Psych  Headaches, Anxiety Depression    GI/Hepatic negative GI ROS, Neg liver ROS,   Endo/Other  Morbid obesity  Renal/GU negative Renal ROS     Musculoskeletal   Abdominal   Peds  Hematology negative hematology ROS (+)   Anesthesia Other Findings   Reproductive/Obstetrics (+) Pregnancy                            Lab Results  Component Value Date   WBC 8.3 09/08/2016   HGB 12.0 09/08/2016   HCT 37.0 09/08/2016   MCV 78.7 09/08/2016   PLT 187 09/08/2016   Lab Results  Component Value Date   CREATININE 0.62 08/08/2014   BUN 11 08/08/2014   NA 137 08/08/2014   K 4.0 08/08/2014   CL 105 08/08/2014   CO2 29 08/08/2014    Anesthesia Physical Anesthesia Plan  ASA: III  Anesthesia Plan: Spinal   Post-op Pain Management:    Induction:   Airway Management Planned: Natural Airway  Additional Equipment:   Intra-op Plan:   Post-operative Plan:   Informed Consent: I have reviewed the patients History and Physical, chart, labs and discussed the procedure including the risks, benefits and alternatives for the proposed anesthesia with the patient or authorized representative who has indicated his/her understanding and acceptance.     Plan Discussed with: CRNA  Anesthesia Plan Comments:         Anesthesia Quick Evaluation

## 2016-09-09 NOTE — Progress Notes (Signed)
MOB was referred for history of depression/anxiety. * Referral screened out by Clinical Social Worker because none of the following criteria appear to apply: ~ History of anxiety/depression during this pregnancy, or of post-partum depression. ~ Diagnosis of anxiety and/or depression within last 3 years OR * MOB's symptoms currently being treated with medication and/or therapy. Please contact the Clinical Social Worker if needs arise, or if MOB requests.

## 2016-09-09 NOTE — Anesthesia Postprocedure Evaluation (Signed)
Anesthesia Post Note  Patient: Amy Costa  Procedure(s) Performed: Procedure(s) (LRB): CESAREAN SECTION WITH BILATERAL TUBAL LIGATION (Bilateral)  Patient location during evaluation: PACU Anesthesia Type: Spinal Level of consciousness: awake and alert Pain management: pain level controlled Vital Signs Assessment: post-procedure vital signs reviewed and stable Respiratory status: spontaneous breathing and respiratory function stable Cardiovascular status: blood pressure returned to baseline and stable Postop Assessment: spinal receding Anesthetic complications: no        Last Vitals:  Vitals:   09/09/16 1523 09/09/16 1531  BP:    Pulse: (!) 55   Resp: 11   Temp:  36.5 C    Last Pain:  Vitals:   09/09/16 1526  TempSrc:   PainSc: 3    Pain Goal:                 [name]

## 2016-09-09 NOTE — Transfer of Care (Signed)
Immediate Anesthesia Transfer of Care Note  Patient: Amy Costa  Procedure(s) Performed: Procedure(s) with comments: CESAREAN SECTION WITH BILATERAL TUBAL LIGATION (Bilateral) - Repeat edc 09/17/16 nkda Jill Side, RNFA  Patient Location: PACU  Anesthesia Type:Spinal  Level of Consciousness: awake  Airway & Oxygen Therapy: Patient Spontanous Breathing  Post-op Assessment: Report given to RN  Post vital signs: Reviewed and stable  Last Vitals:  Vitals:   09/09/16 1134  BP: (!) 156/92  Pulse: 89  Resp: 18  Temp: 37.1 C    Last Pain:  Vitals:   09/09/16 1134  TempSrc: Oral         Complications: No apparent anesthesia complications

## 2016-09-09 NOTE — Anesthesia Procedure Notes (Signed)
Spinal  Patient location during procedure: OR Start time: 09/09/2016 12:55 PM End time: 09/09/2016 1:02 PM Staffing Anesthesiologist: Suzette Battiest Performed: anesthesiologist  Preanesthetic Checklist Completed: patient identified, site marked, surgical consent, pre-op evaluation, timeout performed, IV checked, risks and benefits discussed and monitors and equipment checked Spinal Block Prep: site prepped and draped Patient monitoring: blood pressure, continuous pulse ox and heart rate Approach: midline Location: L4-5 Injection technique: single-shot Needle Needle type: Pencan  Needle gauge: 24 G Needle length: 9 cm Needle insertion depth: 7 cm Assessment Sensory level: T6

## 2016-09-10 LAB — CBC
HCT: 33.2 % — ABNORMAL LOW (ref 36.0–46.0)
Hemoglobin: 10.7 g/dL — ABNORMAL LOW (ref 12.0–15.0)
MCH: 25.3 pg — ABNORMAL LOW (ref 26.0–34.0)
MCHC: 32.2 g/dL (ref 30.0–36.0)
MCV: 78.5 fL (ref 78.0–100.0)
Platelets: 168 10*3/uL (ref 150–400)
RBC: 4.23 MIL/uL (ref 3.87–5.11)
RDW: 14.9 % (ref 11.5–15.5)
WBC: 9.4 10*3/uL (ref 4.0–10.5)

## 2016-09-10 LAB — BIRTH TISSUE RECOVERY COLLECTION (PLACENTA DONATION)

## 2016-09-10 LAB — RPR: RPR Ser Ql: NONREACTIVE

## 2016-09-10 NOTE — Progress Notes (Signed)
Encouraged mom to avoid carbonated drinks. Patient is not passing gas yet

## 2016-09-10 NOTE — Addendum Note (Signed)
Addendum  created 09/10/16 0918 by Jonna Munro, CRNA   Sign clinical note

## 2016-09-10 NOTE — Progress Notes (Signed)
Subjective: Postpartum Day 1: Cesarean Delivery Patient reports tolerating PO.  No complaints.   Objective: Vital signs in last 24 hours: Temp:  [97.7 F (36.5 C)-99.5 F (37.5 C)] 99.5 F (37.5 C) (05/16 0600) Pulse Rate:  [51-89] 71 (05/16 0600) Resp:  [11-21] 18 (05/16 0600) BP: (99-156)/(53-92) 99/62 (05/16 0600) SpO2:  [95 %-100 %] 95 % (05/16 0331) Weight:  [128.4 kg (283 lb)] 128.4 kg (283 lb) (05/15 1540)  Physical Exam:  General: alert, cooperative and appears stated age 4: appropriate Uterine Fundus: firm Incision: healing well, no significant drainage, no dehiscence, no significant erythema DVT Evaluation: No evidence of DVT seen on physical exam. Negative Homan's sign. No cords or calf tenderness. No significant calf/ankle edema.   Recent Labs  09/09/16 1224 09/10/16 0556  HGB 12.2 10.7*  HCT 37.8 33.2*    Assessment/Plan: Status post Cesarean section. Doing well postoperatively.  Continue current care. H/o cHTN - BP normal currently. No meds required.   Tyson Dense 09/10/2016, 9:52 AM

## 2016-09-10 NOTE — Anesthesia Postprocedure Evaluation (Signed)
Anesthesia Post Note  Patient: Amy Costa  Procedure(s) Performed: Procedure(s) (LRB): CESAREAN SECTION WITH BILATERAL TUBAL LIGATION (Bilateral)  Patient location during evaluation: Mother Baby Anesthesia Type: Spinal Level of consciousness: awake and alert and oriented Pain management: satisfactory to patient Vital Signs Assessment: post-procedure vital signs reviewed and stable Respiratory status: spontaneous breathing and nonlabored ventilation Cardiovascular status: stable Postop Assessment: no headache, no backache, patient able to bend at knees, no signs of nausea or vomiting and adequate PO intake Anesthetic complications: no        Last Vitals:  Vitals:   09/10/16 0331 09/10/16 0600  BP: (!) 107/56 99/62  Pulse: 76 71  Resp: 18 18  Temp: 37.4 C 37.5 C    Last Pain:  Vitals:   09/10/16 0600  TempSrc: Oral  PainSc:    Pain Goal: Patients Stated Pain Goal: 3 (09/09/16 1837)               Willa Rough

## 2016-09-11 NOTE — Progress Notes (Signed)
Subjective: Postpartum Day 2: Cesarean Delivery Patient reports tolerating PO.    Objective: Vital signs in last 24 hours: Temp:  [97.8 F (36.6 C)-99.4 F (37.4 C)] 97.8 F (36.6 C) (05/17 0547) Pulse Rate:  [67-84] 84 (05/17 0547) Resp:  [18] 18 (05/17 0547) BP: (126-133)/(61-62) 126/61 (05/17 0547)  Physical Exam:  General: alert Lochia: appropriate Uterine Fundus: firm Incision: healing well DVT Evaluation: No evidence of DVT seen on physical exam.   Recent Labs  09/09/16 1224 09/10/16 0556  HGB 12.2 10.7*  HCT 37.8 33.2*    Assessment/Plan: Status post Cesarean section. Doing well postoperatively.  Continue current care.  Margarette Asal 09/11/2016, 8:43 AM

## 2016-09-12 MED ORDER — IBUPROFEN 800 MG PO TABS: 800.0000 mg | ORAL_TABLET | Freq: Three times a day (TID) | ORAL | 0 refills | Status: DC | PRN

## 2016-09-12 MED ORDER — OXYCODONE-ACETAMINOPHEN 5-325 MG PO TABS: 1.0000 | ORAL_TABLET | ORAL | 0 refills | Status: DC | PRN

## 2016-09-12 NOTE — Discharge Summary (Signed)
Obstetric Discharge Summary Reason for Admission: cesarean section Prenatal Procedures: none Intrapartum Procedures: cesarean: low cervical, transverse Postpartum Procedures: none Complications-Operative and Postpartum: none Hemoglobin  Date Value Ref Range Status  09/10/2016 10.7 (L) 12.0 - 15.0 g/dL Final   HCT  Date Value Ref Range Status  09/10/2016 33.2 (L) 36.0 - 46.0 % Final    Physical Exam:  General: alert, cooperative and appears stated age 36: appropriate Uterine Fundus: firm Incision: healing well, no significant drainage, no dehiscence DVT Evaluation: No evidence of DVT seen on physical exam.  Discharge Diagnoses: Term Pregnancy-delivered  Discharge Information: Date: 09/12/2016 Activity: pelvic rest Diet: routine Medications: Ibuprofen and Percocet Condition: stable Instructions: refer to practice specific booklet Discharge to: home   Newborn Data: Live born female  Birth Weight: 6 lb 13.9 oz (3115 g) APGAR: 9, 10  Home with mother.  Amy Costa L 09/12/2016, 10:32 AM

## 2016-09-26 LAB — HM PAP SMEAR: HM Pap smear: NORMAL

## 2016-10-23 DIAGNOSIS — Z1389 Encounter for screening for other disorder: Secondary | ICD-10-CM | POA: Diagnosis not present

## 2016-10-23 DIAGNOSIS — Z3480 Encounter for supervision of other normal pregnancy, unspecified trimester: Secondary | ICD-10-CM | POA: Diagnosis not present

## 2016-12-19 DIAGNOSIS — L218 Other seborrheic dermatitis: Secondary | ICD-10-CM | POA: Diagnosis not present

## 2016-12-19 DIAGNOSIS — D225 Melanocytic nevi of trunk: Secondary | ICD-10-CM | POA: Diagnosis not present

## 2017-02-18 ENCOUNTER — Ambulatory Visit (INDEPENDENT_AMBULATORY_CARE_PROVIDER_SITE_OTHER): Payer: BLUE CROSS/BLUE SHIELD

## 2017-02-18 ENCOUNTER — Ambulatory Visit (INDEPENDENT_AMBULATORY_CARE_PROVIDER_SITE_OTHER): Payer: BLUE CROSS/BLUE SHIELD | Admitting: Podiatry

## 2017-02-18 ENCOUNTER — Encounter: Payer: Self-pay | Admitting: Podiatry

## 2017-02-18 ENCOUNTER — Other Ambulatory Visit: Payer: Self-pay | Admitting: Podiatry

## 2017-02-18 DIAGNOSIS — M7661 Achilles tendinitis, right leg: Secondary | ICD-10-CM

## 2017-02-18 DIAGNOSIS — M722 Plantar fascial fibromatosis: Secondary | ICD-10-CM

## 2017-02-18 DIAGNOSIS — M79671 Pain in right foot: Secondary | ICD-10-CM

## 2017-02-18 DIAGNOSIS — M79672 Pain in left foot: Principal | ICD-10-CM

## 2017-02-18 MED ORDER — DICLOFENAC SODIUM 75 MG PO TBEC: 75.0000 mg | DELAYED_RELEASE_TABLET | Freq: Two times a day (BID) | ORAL | 0 refills | Status: DC

## 2017-02-18 MED ORDER — TRIAMCINOLONE ACETONIDE 10 MG/ML IJ SUSP
10.0000 mg | Freq: Once | INTRAMUSCULAR | Status: AC
  Administered 2017-02-18: 10 mg

## 2017-02-18 NOTE — Patient Instructions (Signed)

## 2017-02-18 NOTE — Progress Notes (Signed)
Subjective:    Patient ID: Amy Costa, female   DOB: 36 y.o.   MRN: [id number]   HPI patient presents stating that she is having quite a bit of discomfort in the plantar of the right heel and also pain in the back and is concerned about both areas bothering her. States it's been going on now for a few months and get    ROS      Objective:  Physical Exam neurovascular status intact with exquisite discomfort plantar aspect right heel at the insertional point tendon the calcaneus with moderate discomfort in the posterior heel on the lateral side that is associated with bone spur formation.     Assessment:    Acute plantar fasciitis right with Achilles tendinitis right     Plan:    H&P conditions reviewed and injected the plantar right heel 3 mg Kenalog 5 mg Xylocaine and instructed on physical therapy for the posterior heel region. Patient also was discussed orthotics we will get this precertified for her to have to pair orthotics fabricated  X-rays indicate large spur both posterior plantar right heel

## 2017-02-26 ENCOUNTER — Other Ambulatory Visit: Payer: Self-pay | Admitting: Obstetrics and Gynecology

## 2017-02-26 DIAGNOSIS — Z1231 Encounter for screening mammogram for malignant neoplasm of breast: Secondary | ICD-10-CM

## 2017-03-04 ENCOUNTER — Ambulatory Visit: Payer: BLUE CROSS/BLUE SHIELD | Admitting: Podiatry

## 2017-03-04 ENCOUNTER — Encounter: Payer: Self-pay | Admitting: Podiatry

## 2017-03-04 DIAGNOSIS — M7661 Achilles tendinitis, right leg: Secondary | ICD-10-CM

## 2017-03-04 DIAGNOSIS — M722 Plantar fascial fibromatosis: Secondary | ICD-10-CM

## 2017-03-04 MED ORDER — TRIAMCINOLONE ACETONIDE 10 MG/ML IJ SUSP
10.0000 mg | Freq: Once | INTRAMUSCULAR | Status: AC
  Administered 2017-03-04: 10 mg

## 2017-03-04 NOTE — Progress Notes (Signed)
Subjective:    Patient ID: Amy Costa, female   DOB: 36 y.o.   MRN: [id number]   HPI patient states I've had some improvement but still having pain and I know I need better support    ROS      Objective:  Physical Exam neurovascular status intact with patient noted to have discomfort in the plantar aspect of the right heel still upon deep palpation with moderate depression of the arch     Assessment:    Continued fasciitis right with inflammation fluid buildup     Plan:    H&P reviewed condition and at this point I went ahead and I recommended that patient utilize long-term orthotics and she is scanned today and I reinjected the plantar fascia right 3 mg Kenalog 5 mg Xylocaine which was tolerated well

## 2017-03-11 ENCOUNTER — Ambulatory Visit
Admission: RE | Admit: 2017-03-11 | Discharge: 2017-03-11 | Disposition: A | Discharge status: Home / Self Care | Payer: BLUE CROSS/BLUE SHIELD | Source: Ambulatory Visit | Attending: Obstetrics and Gynecology | Admitting: Obstetrics and Gynecology

## 2017-03-11 DIAGNOSIS — Z1231 Encounter for screening mammogram for malignant neoplasm of breast: Secondary | ICD-10-CM

## 2017-03-26 ENCOUNTER — Ambulatory Visit: Payer: BLUE CROSS/BLUE SHIELD | Admitting: Orthotics

## 2017-03-26 DIAGNOSIS — M722 Plantar fascial fibromatosis: Secondary | ICD-10-CM

## 2017-03-26 DIAGNOSIS — M7661 Achilles tendinitis, right leg: Secondary | ICD-10-CM

## 2017-03-26 NOTE — Progress Notes (Signed)
Patient came in today to pick up custom made foot orthotics.  The goals were accomplished and the patient reported no dissatisfaction with said orthotics.  Patient was advised of breakin period and how to report any issues. 

## 2017-06-09 ENCOUNTER — Encounter (INDEPENDENT_AMBULATORY_CARE_PROVIDER_SITE_OTHER): Payer: Self-pay | Admitting: Orthopaedic Surgery

## 2017-06-09 ENCOUNTER — Ambulatory Visit (INDEPENDENT_AMBULATORY_CARE_PROVIDER_SITE_OTHER): Payer: BLUE CROSS/BLUE SHIELD | Admitting: Orthopaedic Surgery

## 2017-06-09 ENCOUNTER — Ambulatory Visit (INDEPENDENT_AMBULATORY_CARE_PROVIDER_SITE_OTHER): Payer: Self-pay

## 2017-06-09 VITALS — BP 160/95 | HR 81 | Resp 14 | Ht 70.0 in | Wt 265.0 lb

## 2017-06-09 DIAGNOSIS — M6528 Calcific tendinitis, other site: Secondary | ICD-10-CM | POA: Diagnosis not present

## 2017-06-09 DIAGNOSIS — M898X9 Other specified disorders of bone, unspecified site: Secondary | ICD-10-CM | POA: Diagnosis not present

## 2017-06-09 DIAGNOSIS — M25522 Pain in left elbow: Secondary | ICD-10-CM | POA: Diagnosis not present

## 2017-06-09 DIAGNOSIS — M65222 Calcific tendinitis, left upper arm: Secondary | ICD-10-CM

## 2017-06-09 DIAGNOSIS — M7712 Lateral epicondylitis, left elbow: Secondary | ICD-10-CM | POA: Diagnosis not present

## 2017-06-09 MED ORDER — METHYLPREDNISOLONE ACETATE 40 MG/ML IJ SUSP
40.0000 mg | INTRAMUSCULAR | Status: AC | PRN
  Administered 2017-06-09: 40 mg via INTRA_ARTICULAR

## 2017-06-09 MED ORDER — METHYLPREDNISOLONE ACETATE 40 MG/ML IJ SUSP
13.3300 mg | INTRAMUSCULAR | Status: AC | PRN
  Administered 2017-06-09: 13.33 mg via INTRA_ARTICULAR

## 2017-06-09 MED ORDER — LIDOCAINE HCL 1 % IJ SOLN
0.5000 mL | INTRAMUSCULAR | Status: AC | PRN
  Administered 2017-06-09: .5 mL

## 2017-06-09 NOTE — Progress Notes (Signed)
Office Visit Note   Patient: Amy Costa           Date of Birth: Dec 28, 1980           MRN: [id number] Visit Date: 06/09/2017              Requested by: Ria Bush, MD [place], [place] [address] PCP: Ria Bush, MD   Assessment & Plan: Visit Diagnoses:  1. Lateral epicondylitis, left elbow   2. Calcific tendinitis of left elbow   3. Heterotopic ossification of bone   4. Pain in left elbow     Plan:  #1: Corticosteroid injection to the left lateral epicondylar area.   #2: She will use a wrist splint that she has at home also to relieve this area. #3: She does not have improvement in next 1-2 weeks she will give [place] a call and we will schedule her for an MRI scan of the left elbow. #4: Patient co-evaluated with Dr. Durward Fortes who concurs with the above   Follow-Up Instructions: Return in about 3 weeks (around 06/30/2017), or if symptoms worsen or fail to improve.   Orders:  Orders Placed This Encounter  Procedures  . Medium Joint Inj  . XR Elbow 2 Views Left   No orders of the defined types were placed in this encounter.     Procedures: Medium Joint Inj: L lateral epicondyle on 06/09/2017 2:13 PM Indications: pain and diagnostic evaluation Details: 25 G 1.5 in needle, lateral approach Medications: 0.5 mL lidocaine 1 %; 40 mg methylPREDNISolone acetate 40 MG/ML; 13.33 mg methylPREDNISolone acetate 40 MG/ML Procedure, treatment alternatives, risks and benefits explained, specific risks discussed. Consent was given by the patient. Immediately prior to procedure a time out was called to verify the correct patient, procedure, equipment, support staff and site/side marked as required. Patient was prepped and draped in the usual sterile fashion.       Clinical Data: No additional findings.   Subjective: Chief Complaint  Patient presents with  . Left Elbow - Pain, Weakness    Amy Costa is a 80 y o here today for Left elbow pain,  hx of L radial nerve.. She relates the pain started about 9 mo ago and becoming worse lass\t 2 mo. Weakness, elbow locking,radiating from shoulder to hand.Ice, heat, aleve no relief    HPI  Amy Costa is a very pleasant 37 year old female who presents today with left lateral elbow pain.  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  Pain though started in the left lateral aspect of the elbow about 9 months ago.  However over the past 2 months it has worsened.  She has been having problems with holding a cup of coffee which causes her pain laterally.  Is also had weakness.  Occasionally she does have the elbow locks.  She does have pain that begins in the upper arm down laterally into the forearm at times.  She is tried ice, heat, nonsteroidal anti-inflammatories but have not been very beneficial.  Denies any numbness at this time.  Review of Systems  Constitutional: Negative for chills, fatigue and fever.  HENT: Negative for hearing loss and tinnitus.   Eyes: Negative for itching.  Respiratory: Negative for chest tightness and shortness of breath.   Cardiovascular: Negative for chest pain, palpitations and leg swelling.  Gastrointestinal: Negative for blood in stool, constipation and diarrhea.  Endocrine: Negative for polyuria.  Genitourinary: Negative for  dysuria.  Musculoskeletal: Positive for neck stiffness. Negative for back pain, joint swelling and neck pain.  Allergic/Immunologic: Negative for immunocompromised state.  Neurological: Negative for dizziness, numbness and headaches.  Hematological: Does not bruise/bleed easily.  Psychiatric/Behavioral: Negative for sleep disturbance. The patient is not nervous/anxious.      Objective: Vital Signs: BP (!) 160/95   Pulse 81   Resp 14   Ht 5\' 10"  (1.778 m)   Wt 265 lb (120.2 kg)   BMI 38.02 kg/m   Physical Exam  Constitutional: She is oriented to person, place, and time. She appears well-developed  and well-nourished.  HENT:  Head: Normocephalic and atraumatic.  Eyes: EOM are normal. Pupils are equal, round, and reactive to light.  Pulmonary/Chest: Effort normal.  Neurological: She is alert and oriented to person, place, and time.  Skin: Skin is warm and dry.  Psychiatric: She has a normal mood and affect. Her behavior is normal. Judgment and thought content normal.    Ortho Exam  Exam today of the left elbow reveals well-healed surgical incisions both along the proximal radial aspect of the forearm as well as along the ulnar aspect overlying the ulnar nerve.  She is exquisitely tender over the lateral epicondylar area.  With the arm in full extension and her wrist dorsiflexed resistance causes her pain discomfort in the elbow.  Otherwise has full pronation supination.  Lacked a few degrees of full extension.  Flexion to about 120 degrees.  Good radial pulse.  Specialty Comments:  No specialty comments available.  Imaging: Xr Elbow 2 Views Left  Result Date: 06/09/2017 2 view x-ray of the left elbow reveals calcification over the lateral epicondylar area with sclerosing. medial epicondyle does have some prominence and irregularity.  Joint spaces are maintained.    PMFS History: Patient Active Problem List   Diagnosis Date Noted  . Pregnancy 09/09/2016  . Group B streptococcal bacteriuria 04/25/2016  . Urinary urgency 06/29/2015  . Redness of eye, right 06/29/2015  . Health maintenance examination 08/15/2014  . Low HDL (under 40) 08/15/2014  . Obesity, Class II, BMI 35-39.9, with comorbidity 08/15/2014  . Depression with anxiety 10/12/2013  . History of hypertension 01/20/2011  . MIGRAINE HEADACHE 04/28/2008  . Cervicalgia 03/09/2008  . History of HPV infection 05/21/2007   Past Medical History:  Diagnosis Date  . Anxiety   . Depression    on zoloft for years  . HPV (human papilloma virus) infection   . Hypertension   . Migraine    Dr Nelva Bush at [facility] ortho  .  Pre-eclampsia     Family History  Problem Relation Age of Onset  . COPD Father        + smoker  . Heart failure Father        CHF  . Hypertension Father   . Stroke Father   . Seizures Father   . Lung cancer Father        + smoker  . Depression Father   . Healthy Mother   . Depression Mother   . Healthy Sister   . Healthy Sister   . Diabetes Paternal Grandfather   . Alcohol abuse Paternal Grandfather   . Depression Maternal Grandmother   . Heart attack Maternal Grandfather   . Alcohol abuse Maternal Grandfather   . Lung cancer Paternal Grandmother        + smoker    Past Surgical History:  Procedure Laterality Date  . ARM WOUND REPAIR / CLOSURE    .  CESAREAN SECTION  01/21/2012   Procedure: CESAREAN SECTION;  Surgeon: Margarette Asal, MD;  Location: [facility];  Service: Obstetrics;  Laterality: N/A;  . CESAREAN SECTION WITH BILATERAL TUBAL LIGATION Bilateral 09/09/2016   Procedure: CESAREAN SECTION WITH BILATERAL TUBAL LIGATION;  Surgeon: Molli Posey, MD;  Location: [facility];  Service: Obstetrics;  Laterality: Bilateral;  Repeat edc 09/17/16 nkda Jill Side, RNFA  . MRI  2010   [facility] - showing bulging disc C5/6  . MYOMECTOMY  01/21/2012   Procedure: MYOMECTOMY;  Surgeon: Margarette Asal, MD;  Location: [facility];  Service: Obstetrics;  Laterality: N/A;   Social History   Occupational History  . 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 Smoking status: Never Smoker  . Smokeless tobacco: Never Used  Substance and Sexual Activity  . Alcohol use: Yes    Comment: Occasional  . Drug use: No  . Sexual activity: Yes    Birth control/protection: None

## 2017-06-16 ENCOUNTER — Other Ambulatory Visit (INDEPENDENT_AMBULATORY_CARE_PROVIDER_SITE_OTHER): Payer: Self-pay

## 2017-06-16 ENCOUNTER — Telehealth (INDEPENDENT_AMBULATORY_CARE_PROVIDER_SITE_OTHER): Payer: Self-pay | Admitting: Orthopaedic Surgery

## 2017-06-16 DIAGNOSIS — M25522 Pain in left elbow: Secondary | ICD-10-CM

## 2017-06-16 NOTE — Telephone Encounter (Signed)
Patient called stating she did not get any pain relief after her injection and was told to contact the office so an MRI could be scheduled.

## 2017-06-16 NOTE — Telephone Encounter (Signed)
Sent referral 

## 2017-06-17 ENCOUNTER — Telehealth (INDEPENDENT_AMBULATORY_CARE_PROVIDER_SITE_OTHER): Payer: Self-pay | Admitting: *Deleted

## 2017-06-17 NOTE — Telephone Encounter (Signed)
Please advise 

## 2017-06-17 NOTE — Telephone Encounter (Signed)
Pt states she is in pain and wants to know if you could prescribe her pain medication, she has taken OTC ALEVE AND IBUPROFEN and its not touching it.   FYI: she has MRI scheduled on Friday March 1 at 1  Please advise

## 2017-06-18 ENCOUNTER — Other Ambulatory Visit (INDEPENDENT_AMBULATORY_CARE_PROVIDER_SITE_OTHER): Payer: Self-pay | Admitting: Orthopedic Surgery

## 2017-06-18 MED ORDER — TRAMADOL HCL 50 MG PO TABS: 50.0000 mg | ORAL_TABLET | Freq: Four times a day (QID) | ORAL | 0 refills | Status: DC | PRN

## 2017-06-18 NOTE — Telephone Encounter (Signed)
Tramadol rx sent to pharmacy

## 2017-06-26 ENCOUNTER — Ambulatory Visit (HOSPITAL_COMMUNITY)
Admission: RE | Admit: 2017-06-26 | Discharge: 2017-06-26 | Disposition: A | Discharge status: Home / Self Care | Payer: BLUE CROSS/BLUE SHIELD | Source: Ambulatory Visit | Attending: Orthopaedic Surgery | Admitting: Orthopaedic Surgery

## 2017-06-26 DIAGNOSIS — X58XXXA Exposure to other specified factors, initial encounter: Secondary | ICD-10-CM | POA: Insufficient documentation

## 2017-06-26 DIAGNOSIS — M25522 Pain in left elbow: Secondary | ICD-10-CM

## 2017-06-26 DIAGNOSIS — M25422 Effusion, left elbow: Secondary | ICD-10-CM | POA: Diagnosis not present

## 2017-06-26 DIAGNOSIS — M24122 Other articular cartilage disorders, left elbow: Secondary | ICD-10-CM | POA: Diagnosis not present

## 2017-06-26 DIAGNOSIS — S56512A Strain of other extensor muscle, fascia and tendon at forearm level, left arm, initial encounter: Secondary | ICD-10-CM | POA: Diagnosis not present

## 2017-07-03 ENCOUNTER — Encounter (INDEPENDENT_AMBULATORY_CARE_PROVIDER_SITE_OTHER): Payer: Self-pay | Admitting: Orthopaedic Surgery

## 2017-07-03 ENCOUNTER — Other Ambulatory Visit (INDEPENDENT_AMBULATORY_CARE_PROVIDER_SITE_OTHER): Payer: Self-pay | Admitting: Radiology

## 2017-07-03 ENCOUNTER — Ambulatory Visit (INDEPENDENT_AMBULATORY_CARE_PROVIDER_SITE_OTHER): Payer: BLUE CROSS/BLUE SHIELD | Admitting: Orthopaedic Surgery

## 2017-07-03 VITALS — BP 142/89 | HR 77 | Resp 18 | Ht 70.0 in | Wt 270.0 lb

## 2017-07-03 DIAGNOSIS — M7712 Lateral epicondylitis, left elbow: Secondary | ICD-10-CM

## 2017-07-03 DIAGNOSIS — M25522 Pain in left elbow: Secondary | ICD-10-CM

## 2017-07-03 NOTE — Progress Notes (Signed)
Office Visit Note   Patient: Amy Costa           Date of Birth: [birth year]           MRN: [id number] Visit Date: 07/03/2017              Requested by: Ria Bush, MD [address] [place], [place] [address] PCP: Ria Bush, MD   Assessment & Plan: Visit Diagnoses:  1. Lateral epicondylitis of left elbow     Plan: MRI scan demonstrates changes of chronic calcific tendinitis versus a remote small avulsion fracture along the common flexor forearm origin. Also has evidence of prior avulsion injury versus less likely calcific tendinitis of the common forearm extensor origin. She is more symptomatic laterally than she is medially. She did not have a good response to the cortisone injection. Did discuss the MRI scan with Mrs. Mancel Bale. We'll try a tennis elbow splint, Pennsaid, and physical therapy. Office 6 weeks  Follow-Up Instructions: Return in about 6 weeks (around 08/14/2017).   Orders:  No orders of the defined types were placed in this encounter.  No orders of the defined types were placed in this encounter.     Procedures: No procedures performed   Clinical Data: No additional findings.   Subjective: No chief complaint on file. Seen 1 month ago for evaluation of left nondominant elbow pain. As had prior radial nerve decompression and ulnar nerve decompression at the same elbow. Those symptoms appear to have resolved. Still has some sensitivity of the skin medially. She's having predominantly symptoms of tennis elbow. With a poor response to the cortisone we've obtained the MRI scan. Results are as above. Not really having numbness or tingling. Has had pain for probably months  HPI  Review of Systems   Objective: Vital Signs: BP (!) 142/89 (BP Location: Right Arm, Patient Position: Sitting, Cuff Size: Normal)   Pulse 77   Resp 18   Ht 5\' 10"  (1.778 m)   Wt 270 lb (122.5 kg)   BMI 38.74 kg/m   Physical Exam  Ortho Exam awake alert and  oriented 3. Comfortable sitting. Examination the left elbow reveals no evidence of erythema or ecchymosis. No obvious swelling. Excellent range of motion without pain with pronation supination. Does have some tenderness over the lateral lip condyle which is a little out of proportion to what I would expect. Her old incisions for the radial nerve decompression of healed. Also a significant skin sensitivity along the medial aspect of her elbow which she notes is chronic. It was hard to evaluate the medial epicondyles but she does have some discomfort in that area as well. Good grip and good release. More pain with grip and extension along the lateral epicondyle  Specialty Comments:  No specialty comments available.  Imaging: No results found.   PMFS History: Patient Active Problem List   Diagnosis Date Noted  . Pregnancy 09/09/2016  . Group B streptococcal bacteriuria 04/25/2016  . Urinary urgency 06/29/2015  . Redness of eye, right 06/29/2015  . Health maintenance examination 08/15/2014  . Low HDL (under 40) 08/15/2014  . Obesity, Class II, BMI 35-39.9, with comorbidity 08/15/2014  . Depression with anxiety 10/12/2013  . History of hypertension 01/20/2011  . MIGRAINE HEADACHE 04/28/2008  . Cervicalgia 03/09/2008  . History of HPV infection 05/21/2007   Past Medical History:  Diagnosis Date  . Anxiety   . Depression    on zoloft for years  . HPV (human papilloma virus)  infection   . Hypertension   . Migraine    Dr Nelva Bush at [facility] ortho  . Pre-eclampsia     Family History  Problem Relation Age of Onset  . COPD Father        + smoker  . Heart failure Father        CHF  . Hypertension Father   . Stroke Father   . Seizures Father   . Lung cancer Father        + smoker  . Depression Father   . Healthy Mother   . Depression Mother   . Healthy Sister   . Healthy Sister   . Diabetes Paternal Grandfather   . Alcohol abuse Paternal Grandfather   . Depression Maternal  Grandmother   . Heart attack Maternal Grandfather   . Alcohol abuse Maternal Grandfather   . Lung cancer Paternal Grandmother        + smoker    Past Surgical History:  Procedure Laterality Date  . ARM WOUND REPAIR / CLOSURE    . CESAREAN SECTION  01/21/2012   Procedure: CESAREAN SECTION;  Surgeon: Margarette Asal, MD;  Location: [facility];  Service: Obstetrics;  Laterality: N/A;  . CESAREAN SECTION WITH BILATERAL TUBAL LIGATION Bilateral 09/09/2016   Procedure: CESAREAN SECTION WITH BILATERAL TUBAL LIGATION;  Surgeon: Molli Posey, MD;  Location: [facility];  Service: Obstetrics;  Laterality: Bilateral;  Repeat edc 09/17/16 nkda Jill Side, RNFA  . MRI  2010   [facility] - showing bulging disc C5/6  . MYOMECTOMY  01/21/2012   Procedure: MYOMECTOMY;  Surgeon: Margarette Asal, MD;  Location: [facility];  Service: Obstetrics;  Laterality: N/A;   Social History   Occupational History  . 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 Smoking status: Never Smoker  . Smokeless tobacco: Never Used  Substance and Sexual Activity  . Alcohol use: Yes    Comment: Occasional  . Drug use: No  . Sexual activity: Yes    Birth control/protection: None     Garald Balding, MD   Note - This record has been created using [employer].  Chart creation errors have been sought, but may not always  have been located. Such creation errors do not reflect on  the standard of medical care.

## 2017-07-07 ENCOUNTER — Telehealth (INDEPENDENT_AMBULATORY_CARE_PROVIDER_SITE_OTHER): Payer: Self-pay | Admitting: Orthopaedic Surgery

## 2017-07-07 ENCOUNTER — Encounter (INDEPENDENT_AMBULATORY_CARE_PROVIDER_SITE_OTHER): Payer: Self-pay | Admitting: Radiology

## 2017-07-07 NOTE — Telephone Encounter (Signed)
Patient calling in regards to lt arm. Patient was seen on Friday. Patient wants to be out of work until arm gets better. Patient that's work is hindering her. Please call to discuss.

## 2017-07-07 NOTE — Telephone Encounter (Signed)
Note documented and signed by dr. Then I called pt to let her know it was ready for pick up.

## 2017-07-07 NOTE — Telephone Encounter (Signed)
Ok to be out of work this week.will need to return to office for any further out of work excuse

## 2017-07-07 NOTE — Telephone Encounter (Signed)
Please advise 

## 2017-07-13 ENCOUNTER — Encounter (INDEPENDENT_AMBULATORY_CARE_PROVIDER_SITE_OTHER): Payer: Self-pay | Admitting: Orthopaedic Surgery

## 2017-07-13 ENCOUNTER — Ambulatory Visit (INDEPENDENT_AMBULATORY_CARE_PROVIDER_SITE_OTHER): Payer: BLUE CROSS/BLUE SHIELD | Admitting: Orthopaedic Surgery

## 2017-07-13 VITALS — BP 139/98 | HR 72 | Resp 16 | Ht 70.0 in | Wt 270.0 lb

## 2017-07-13 DIAGNOSIS — M25522 Pain in left elbow: Secondary | ICD-10-CM

## 2017-07-13 MED ORDER — GABAPENTIN 100 MG PO CAPS: 100.0000 mg | ORAL_CAPSULE | Freq: Three times a day (TID) | ORAL | 0 refills | Status: DC

## 2017-07-13 NOTE — Progress Notes (Signed)
Office Visit Note   Patient: Amy Costa           Date of Birth: 08-15-1980           MRN: [id number] Visit Date: 07/13/2017              Requested by: Ria Bush, MD [address] [place], [place] [address] PCP: Ria Bush, MD   Assessment & Plan: Visit Diagnoses:  1. Pain in left elbow     Plan: Left elbow pain with several possible etiologies.  Having pain out of proportion to what I would expect with either medial or lateral epicondylitis. continue with tennis elbow splint.  Start physical therapy next week.  Try gabapentin 100 mg 3 times a day patient will check with work regarding possible light duty and let me know about work note.  Otherwise may return today.  Office 2 weeks  Follow-Up Instructions: Return in about 2 weeks (around 07/27/2017).   Orders:  No orders of the defined types were placed in this encounter.  Meds ordered this encounter  Medications  . gabapentin (NEURONTIN) 100 MG capsule    Sig: Take 1 capsule (100 mg total) by mouth 3 (three) times daily.    Dispense:  30 capsule    Refill:  0      Procedures: No procedures performed   Clinical Data: No additional findings.   Subjective: Chief Complaint  Patient presents with  . Left Forearm - Pain    Amy Costa IS 36 Y O F HERE FOR F/U FROM A WEEK AGO WITH LEFT ELBOW/FOREARM PAIN. GIVEN TENNIS ELBOW BRACE AT LAST VISIT HELPS SOME, BUT HURTS AT TIMES PRESSING ON THE NERVE  Recent MRI scan of left elbow demonstrated no acute bony findings.  There was moderate common extensor tendinopathy with a partial thickness tear involving the deep fibers.  Mild degenerative changes of the elbow with a small effusion .mild flexor tendinopathy.  Consistent with chronic avulsion injuries involving the medial lateral epicondyles.  Seems out of proportion to what I would expect continuous pain.  Numbness or tingling.  Had prior radial and ulnar nerve decompression years ago.  Not having any  numbness or tingling  HPI  Review of Systems  Constitutional: Positive for fatigue. Negative for fever.  HENT: Negative for ear pain.   Eyes: Negative for pain.  Respiratory: Negative for cough and shortness of breath.   Cardiovascular: Negative for leg swelling.  Gastrointestinal: Negative for blood in stool, constipation and diarrhea.  Genitourinary: Negative for dysuria.  Musculoskeletal: Negative for back pain and neck pain.  Skin: Negative for rash and wound.  Allergic/Immunologic: Negative for food allergies.  Neurological: Positive for weakness and numbness. Negative for dizziness, light-headedness and headaches.  Hematological: Does not bruise/bleed easily.  Psychiatric/Behavioral: Positive for sleep disturbance.     Objective: Vital Signs: BP (!) 139/98 (BP Location: Right Arm, Patient Position: Sitting, Cuff Size: Normal)   Pulse 72   Resp 16   Ht 5\' 10"  (1.778 m)   Wt 270 lb (122.5 kg)   BMI 38.74 kg/m   Physical Exam  Ortho Exam pain out of proportion to exam.  Has cutaneous pain along the her prior incisions and over the lateral epicondyle.  No redness.  No ecchymosis.  Full range of motion of elbow.  Neurovascular exam intact distally.  Mild pain with grip and extension over the lateral epicondyle none with flexion.  No pain medially with shoulder motion  Specialty Comments:  No specialty comments available.  Imaging: No results found.   PMFS History: Patient Active Problem List   Diagnosis Date Noted  . Pregnancy 09/09/2016  . Group B streptococcal bacteriuria 04/25/2016  . Urinary urgency 06/29/2015  . Redness of eye, right 06/29/2015  . Health maintenance examination 08/15/2014  . Low HDL (under 40) 08/15/2014  . Obesity, Class II, BMI 35-39.9, with comorbidity 08/15/2014  . Depression with anxiety 10/12/2013  . History of hypertension 01/20/2011  . MIGRAINE HEADACHE 04/28/2008  . Cervicalgia 03/09/2008  . History of HPV infection 05/21/2007    Past Medical History:  Diagnosis Date  . Anxiety   . Depression    on zoloft for years  . HPV (human papilloma virus) infection   . Hypertension   . Migraine    Dr Nelva Bush at [facility] ortho  . Pre-eclampsia     Family History  Problem Relation Age of Onset  . COPD Father        + smoker  . Heart failure Father        CHF  . Hypertension Father   . Stroke Father   . Seizures Father   . Lung cancer Father        + smoker  . Depression Father   . Healthy Mother   . Depression Mother   . Healthy Sister   . Healthy Sister   . Diabetes Paternal Grandfather   . Alcohol abuse Paternal Grandfather   . Depression Maternal Grandmother   . Heart attack Maternal Grandfather   . Alcohol abuse Maternal Grandfather   . Lung cancer Paternal Grandmother        + smoker    Past Surgical History:  Procedure Laterality Date  . ARM WOUND REPAIR / CLOSURE    . CESAREAN SECTION  01/21/2012   Procedure: CESAREAN SECTION;  Surgeon: Margarette Asal, MD;  Location: [facility];  Service: Obstetrics;  Laterality: N/A;  . CESAREAN SECTION WITH BILATERAL TUBAL LIGATION Bilateral 09/09/2016   Procedure: CESAREAN SECTION WITH BILATERAL TUBAL LIGATION;  Surgeon: Molli Posey, MD;  Location: [facility];  Service: Obstetrics;  Laterality: Bilateral;  Repeat edc 09/17/16 nkda Jill Side, RNFA  . MRI  2010   [facility] - showing bulging disc C5/6  . MYOMECTOMY  01/21/2012   Procedure: MYOMECTOMY;  Surgeon: Margarette Asal, MD;  Location: [facility];  Service: Obstetrics;  Laterality: N/A;   Social History   Occupational History  . 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 Smoking status: Never Smoker  . Smokeless tobacco: Never Used  Substance and Sexual Activity  . Alcohol use: Yes    Comment: Occasional  . Drug use: No  . Sexual activity: Yes    Birth control/protection: None

## 2017-07-15 ENCOUNTER — Telehealth (INDEPENDENT_AMBULATORY_CARE_PROVIDER_SITE_OTHER): Payer: Self-pay | Admitting: Orthopaedic Surgery

## 2017-07-15 ENCOUNTER — Ambulatory Visit: Payer: BLUE CROSS/BLUE SHIELD | Admitting: Physical Therapy

## 2017-07-15 NOTE — Telephone Encounter (Signed)
Patient states she is not able to be out of work completely due to lack of FMLA. Patient needs a note with specific restrictions listed for light duty. Please call patient to discuss type of work she does, and can not do due to her condition.

## 2017-07-16 NOTE — Telephone Encounter (Signed)
Call pt and ask her what restrictions she needs

## 2017-07-16 NOTE — Telephone Encounter (Signed)
Spoke with pt. She needs a work note stating what restrictions you would like her to follow since she dose not have enough FMLA time to take off. Note needs to be more specific than just light duty. 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 Please advise what restrictions in the note so I can type it up fer her. Thank you!!

## 2017-07-20 ENCOUNTER — Encounter: Payer: Self-pay | Admitting: Physical Therapy

## 2017-07-20 ENCOUNTER — Encounter (INDEPENDENT_AMBULATORY_CARE_PROVIDER_SITE_OTHER): Payer: Self-pay | Admitting: Radiology

## 2017-07-20 ENCOUNTER — Ambulatory Visit: Payer: BLUE CROSS/BLUE SHIELD | Attending: Orthopaedic Surgery | Admitting: Physical Therapy

## 2017-07-20 ENCOUNTER — Telehealth (INDEPENDENT_AMBULATORY_CARE_PROVIDER_SITE_OTHER): Payer: Self-pay | Admitting: Orthopaedic Surgery

## 2017-07-20 DIAGNOSIS — M6281 Muscle weakness (generalized): Secondary | ICD-10-CM | POA: Diagnosis not present

## 2017-07-20 DIAGNOSIS — M25622 Stiffness of left elbow, not elsewhere classified: Secondary | ICD-10-CM | POA: Insufficient documentation

## 2017-07-20 DIAGNOSIS — M25522 Pain in left elbow: Secondary | ICD-10-CM | POA: Insufficient documentation

## 2017-07-20 NOTE — Therapy (Signed)
[place], [place], [address] Phone: [phone]   Fax:  [phone]  Physical Therapy Evaluation  Patient Details  Name: Amy Costa MRN: [id number] Date of Birth: 10/11/1980 Referring Provider: Dr. Joni Costa    Encounter Date: 07/20/2017  PT End of Session - 07/20/17 0844    Visit Number  1    Number of Visits  12    Date for PT Re-Evaluation  08/31/17    PT Start Time  0758    PT Stop Time  0852    PT Time Calculation (min)  54 min    Activity Tolerance  Patient tolerated treatment well    Behavior During Therapy  [facility] for tasks assessed/performed       Past Medical History:  Diagnosis Date  . Anxiety   . Depression    on zoloft for years  . HPV (human papilloma virus) infection   . Hypertension   . Migraine    Dr Amy Costa at [facility] ortho  . Pre-eclampsia     Past Surgical History:  Procedure Laterality Date  . ARM WOUND REPAIR / CLOSURE    . CESAREAN SECTION  01/21/2012   Procedure: CESAREAN SECTION;  Surgeon: Amy Asal, MD;  Location: [facility];  Service: Obstetrics;  Laterality: N/A;  . CESAREAN SECTION WITH BILATERAL TUBAL LIGATION Bilateral 09/09/2016   Procedure: CESAREAN SECTION WITH BILATERAL TUBAL LIGATION;  Surgeon: Amy Posey, MD;  Location: [facility];  Service: Obstetrics;  Laterality: Bilateral;  Repeat edc 09/17/16 nkda Amy Costa, RNFA  . MRI  2010   [facility] - showing bulging disc C5/6  . MYOMECTOMY  01/21/2012   Procedure: MYOMECTOMY;  Surgeon: Amy Asal, MD;  Location: [facility];  Service: Obstetrics;  Laterality: N/A;    There were no vitals filed for this visit.   Subjective Assessment - 07/20/17 0804    Subjective  Pt has had chronic pain and limitations in L arm.  About 6 mos ago she noticed increased pain and difficulty using her L arm.  She describes weakness, pain but no sensory changes.  She past 3 mos it has become so painful she can't  sleep on her L Costa.   She has dropped items, her grip is weak.  She Costa she will not be able to hold her 52 mo old child safely.  Pain turning a doorknob, working.   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  She has less pain on the weekends but still has s degree of pain constantly.     Pertinent History  L radial tunnel surgery 2005, hysterectomy, anxiety, depression     Limitations  Walking;Lifting;House hold activities;Other (comment) childcare, sleeping     How long can you walk comfortably?  even walking long distances with her arm down by her Costa    Diagnostic tests  MRI: mod extensor tendonopathy, mild medial epicondyle, tendinopathy, Probable chronic avulsion injuries involving the medial and lateral epicondyle, partial thickness tear of deep fibres of common extensor tendon     Patient Stated Goals  Patient would like pain relief, some strength.      Currently in Pain?  Yes    Pain Score  4     Pain Location  Elbow    Pain Orientation  Left    Pain Descriptors / Indicators  Burning    Pain Type  Chronic pain    Pain Radiating Towards  sometimes up into upper L arm     Pain Onset  More than a month ago    Pain Frequency  Constant    Aggravating Factors   brace, pressure, working    Pain Relieving Factors  RICE    Effect of Pain on Daily Activities  limits her comfort at work, afraid to drop her 33 mos old          [facility] PT Assessment - 07/20/17 0001      Assessment   Medical Diagnosis  L lateral epicondylitis    Referring Provider  Dr. Joni Costa     Onset Date/Surgical Date  -- chronic Feb 2019 flare up    Hand Dominance  Right    Next MD Visit  few weeks     Prior Therapy  Yes      Precautions   Precautions  None      Restrictions   Weight Bearing Restrictions  No      Balance Screen   Has the patient fallen in the past 6 months  No      [place] residence    Living Arrangements   Spouse/significant other;Children    Additional Comments  10 mos, 5 yr children       Prior Function   Level of Independence  Independent;Independent with basic ADLs;Independent with household mobility without device;Independent with community mobility without device      Observation/Other Assessments   Focus on Therapeutic Outcomes (FOTO)   51%      Sensation   Light Touch  Appears Intact    Additional Comments  chronic soreness and slight numbness along "old" L lateral incision       Posture/Postural Control   Posture Comments  not remarkable       AROM   Overall AROM Comments  forearm supination painful, wrist WFL, WFL except elbow ext lacks 10 deg. on L Costa.       PROM   Overall PROM Comments  pain end range supination, elbow ext/wrist flexion.        Strength   Overall Strength Comments  Grip Rt.  34, 26, 26,kg and Lt 14, 20, 18 kg      Palpation   Palpation comment  tender lateral epicondyle and into extensor compartment               No data recorded  Objective measurements completed on examination: See above findings.      [facility] PT Treatment/Exercise - 07/20/17 0001      Cryotherapy   Number Minutes Cryotherapy  8 Minutes    Cryotherapy Location  Forearm elbow     Type of Cryotherapy  Ice pack      Manual Therapy   Soft tissue mobilization  IASTM to L extensors and flexors of wrist , lighter pressure used for comfort              PT Education - 07/20/17 0843    Education provided  Yes    Education Details  PT/POC , HEP, RICE, Soft tissue, ionto     Person(s) Educated  Patient    Methods  Explanation;Demonstration;Handout    Comprehension  Verbalized understanding;Need further instruction          PT Long Term Goals - 07/20/17 1008      PT LONG TERM GOAL #1   Title  Pt will be able to score 30% or less on FOTO demonstrating  improved functional mobility.     Time  6    Period  Weeks    Status  New    Target Date  08/31/17       PT LONG TERM GOAL #2   Title  Pt will be able to sleep on L Costa for part of the night (25%) due to less pain     Time  6    Period  Weeks    Status  New    Target Date  08/31/17      PT LONG TERM GOAL #3   Title  Pt will be able to improve grip on L UE by 10 lbs on LUE for improved safety and grip.      Time  6    Period  Weeks    Status  New    Target Date  08/31/17      PT LONG TERM GOAL #4   Title  Pt will be able to open doors with L hand with 50% less pain.     Time  6    Period  Weeks    Status  New    Target Date  08/31/17             Plan - 07/20/17 0845    Clinical Impression Statement  Patient presents for mod complexity eval of L sided lateral epicondylitis. This issue has developed surrounding the area she had surgically repaired in 2005.  She presents with pain, weakness and functional limitations which involve the safety of her 65 mo old child. 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  She may have a reduction in symptoms with PT but her progress will be limited due to the chronicity of her condition.     History and Personal Factors relevant to plan of care:  previous surgery on this area    Clinical Presentation  Evolving    Clinical Presentation due to:  worsening of sx, progressive weakness in grip     Clinical Decision Making  Moderate    Rehab Potential  Good    PT Frequency  2x / week    PT Duration  6 weeks    PT Treatment/Interventions  Iontophoresis 4mg /ml Dexamethasone;ADLs/Self Care Home Management;Electrical Stimulation;Functional mobility training;Ultrasound;Passive range of motion;Manual techniques;Patient/family education;Therapeutic exercise;Taping;Scar mobilization;Therapeutic activities;Cryotherapy    PT Next Visit Plan  ROM , strength, manual and ionto , check HEP, give putty?    PT Home Exercise Plan  stretching to extensors, flexors, supination, pronation     Consulted and Agree with Plan of  Care  Patient       Patient will benefit from skilled therapeutic intervention in order to improve the following deficits and impairments:  Decreased mobility, Increased fascial restricitons, Impaired flexibility, Impaired UE functional use, Pain, Decreased strength  Visit Diagnosis: Pain in left elbow  Stiffness of left elbow, not elsewhere classified  Muscle weakness (generalized)     Problem List Patient Active Problem List   Diagnosis Date Noted  . Pregnancy 09/09/2016  . Group B streptococcal bacteriuria 04/25/2016  . Urinary urgency 06/29/2015  . Redness of eye, right 06/29/2015  . Health maintenance examination 08/15/2014  . Low HDL (under 40) 08/15/2014  . Obesity, Class II, BMI 35-39.9, with comorbidity 08/15/2014  . Depression with anxiety 10/12/2013  . History of hypertension 01/20/2011  . MIGRAINE HEADACHE 04/28/2008  . Cervicalgia 03/09/2008  . History of HPV infection 05/21/2007    PAA,JENNIFER  07/20/2017, 10:38 AM  [place], [place], [address] Phone: [phone]   Fax:  [phone]  Name: Amy Costa MRN: [id number] Date of Birth: June 26, 1980   Raeford Razor, PT 07/20/17 10:38 AM Phone: [phone] Fax: [phone]

## 2017-07-20 NOTE — Telephone Encounter (Signed)
Patient called stating that she received her letter for work that you faxed to her, but there are no dates on it.  Patient is requesting another letter be faxed.  Patient states she is scheduled to see Dr. Durward Fortes on 4/22.

## 2017-07-20 NOTE — Telephone Encounter (Signed)
SENT NEW NOTE WITH DATE OF RETURN ON 08/17/2017

## 2017-07-20 NOTE — Patient Instructions (Addendum)
Access Code: [email or web address]  URL: [email or web address]  Date: 07/20/2017  Prepared by: Raeford Razor   Exercises  Standing Wrist Extension Stretch - 5 reps - 1 sets - 30 hold - 3x daily - 7x weekly  Standing Wrist Flexion Stretch - 5 reps - 1 sets - 30 hold - 3x daily - 7x weekly  Wrist Pronation Stretch - 5 reps - 1 sets - 30 hold - 3x daily - 7x weekly  Seated Wrist Supination Stretch - 5 reps - 1 sets - 30 hold - 3x daily - 7x weekly

## 2017-07-21 ENCOUNTER — Telehealth (INDEPENDENT_AMBULATORY_CARE_PROVIDER_SITE_OTHER): Payer: Self-pay | Admitting: Orthopaedic Surgery

## 2017-07-21 ENCOUNTER — Encounter: Payer: Self-pay | Admitting: Radiology

## 2017-07-21 NOTE — Telephone Encounter (Signed)
Spoke with pt and contacted nurse brenda nixon at [phone] to verify pt can have 4 15 min breaks per day.

## 2017-07-21 NOTE — Telephone Encounter (Signed)
Spoke with patient about restrictions and faxed note to her.

## 2017-07-21 NOTE — Telephone Encounter (Signed)
Patient calling to discuss current work note. Needs modification again.

## 2017-07-21 NOTE — Telephone Encounter (Signed)
Faxed note to [phone]

## 2017-07-22 ENCOUNTER — Ambulatory Visit: Payer: BLUE CROSS/BLUE SHIELD | Admitting: Physical Therapy

## 2017-07-22 ENCOUNTER — Encounter: Payer: Self-pay | Admitting: Physical Therapy

## 2017-07-22 DIAGNOSIS — M6281 Muscle weakness (generalized): Secondary | ICD-10-CM | POA: Diagnosis not present

## 2017-07-22 DIAGNOSIS — M25622 Stiffness of left elbow, not elsewhere classified: Secondary | ICD-10-CM

## 2017-07-22 DIAGNOSIS — M25522 Pain in left elbow: Secondary | ICD-10-CM | POA: Diagnosis not present

## 2017-07-22 NOTE — Patient Instructions (Signed)

## 2017-07-22 NOTE — Therapy (Signed)
[place], [place], [address] Phone: [phone]   Fax:  [phone]  Physical Therapy Treatment  Patient Details  Name: Amy Costa MRN: [id number] Date of Birth: [birth year] Referring Provider: Dr. Joni Fears    Encounter Date: 07/22/2017  PT End of Session - 07/22/17 1728    Visit Number  2    Number of Visits  12    PT Start Time  3474    PT Stop Time  1720    PT Time Calculation (min)  47 min    Activity Tolerance  Patient tolerated treatment well    Behavior During Therapy  [facility] for tasks assessed/performed       Past Medical History:  Diagnosis Date  . Anxiety   . Depression    on zoloft for years  . HPV (human papilloma virus) infection   . Hypertension   . Migraine    Dr Nelva Bush at [facility] ortho  . Pre-eclampsia     Past Surgical History:  Procedure Laterality Date  . ARM WOUND REPAIR / CLOSURE    . CESAREAN SECTION  01/21/2012   Procedure: CESAREAN SECTION;  Surgeon: Margarette Asal, MD;  Location: [facility];  Service: Obstetrics;  Laterality: N/A;  . CESAREAN SECTION WITH BILATERAL TUBAL LIGATION Bilateral 09/09/2016   Procedure: CESAREAN SECTION WITH BILATERAL TUBAL LIGATION;  Surgeon: Molli Posey, MD;  Location: [place];  Service: Obstetrics;  Laterality: Bilateral;  Repeat edc 09/17/16 nkda Jill Side, RNFA  . MRI  2010   [facility] - showing bulging disc C5/6  . MYOMECTOMY  01/21/2012   Procedure: MYOMECTOMY;  Surgeon: Margarette Asal, MD;  Location: [place] [facility];  Service: Obstetrics;  Laterality: N/A;    There were no vitals filed for this visit.  Subjective Assessment - 07/22/17 1636    Subjective    Exercises incerease  Pain.  it feels like nerve stretch.  I have tried to do the reps and frequency but have not been able.     Currently in Pain?  Yes    Pain Location  Elbow    Pain Orientation  Left    Pain Descriptors / Indicators  Burning;Shooting nerve pain    Pain Radiating Towards  into upper arm  to hand    Pain Frequency  Constant    Aggravating Factors   hooking bar, exrecise hurts,  unable to sleep on left.     Pain Relieving Factors    uses right.    RICE    Effect of Pain on Daily Activities  needs help to pick things up,      Multiple Pain Sites  -- gets migraines 1-2 X a month,,  heel spurs  RT>LT                No data recorded       [facility] Adult PT Treatment/Exercise - 07/22/17 0001      Elbow Exercises   Other elbow exercises  stretches reviewed HEO pillow helpful,  gliding supinator helpful with      Cryotherapy   Number Minutes Cryotherapy  5 Minutes    Cryotherapy Location  Forearm    Type of Cryotherapy  -- cold pack      Iontophoresis   Type of Iontophoresis  Dexamethasone    Location  lateral elbow    Dose  1cc, 4 mg/mL     Time  4-6 hour patch      Manual  Therapy   Manual therapy comments  gentle distraction,  PROM, radial mobs grade 2,  with movement,  fascial glides, taping to assist supination applied  then removed due to patient needed ionto treatment.    Soft tissue mobilization  see above             PT Education - 07/22/17 1728    Education provided  Yes    Education Details  ionto  info precautions    Person(s) Educated  Patient    Methods  Explanation;Handout    Comprehension  Verbalized understanding          PT Long Term Goals - 07/20/17 1008      PT LONG TERM GOAL #1   Title  Pt will be able to score 30% or less on FOTO demonstrating improved functional mobility.     Time  6    Period  Weeks    Status  New    Target Date  08/31/17      PT LONG TERM GOAL #2   Title  Pt will be able to sleep on L side for part of the night (25%) due to less pain     Time  6    Period  Weeks    Status  New    Target Date  08/31/17      PT LONG TERM GOAL #3   Title  Pt will be able to improve grip on L UE by 10 lbs on LUE for improved safety and grip.      Time  6    Period  Weeks     Status  New    Target Date  08/31/17      PT LONG TERM GOAL #4   Title  Pt will be able to open doors with L hand with 50% less pain.     Time  6    Period  Weeks    Status  New    Target Date  08/31/17            Plan - 07/22/17 1729    Clinical Impression Statement  Patient had pain with supination a burning nerve pain,  She gets increased supination with myofascial glides and taping to  supinator,  Tape had to be removed due to starting iontophoresis..    PT Next Visit Plan  ROM , strength, manual and  assess ionto  , check HEP  again, give putty?    PT Home Exercise Plan  stretching to extensors, flexors  ( modified to be done resting on pillow), supination, ( does better with pulling supinator superior with motion)  pronation     Consulted and Agree with Plan of Care  Patient       Patient will benefit from skilled therapeutic intervention in order to improve the following deficits and impairments:     Visit Diagnosis: Pain in left elbow  Stiffness of left elbow, not elsewhere classified  Muscle weakness (generalized)     Problem List Patient Active Problem List   Diagnosis Date Noted  . Pregnancy 09/09/2016  . Group B streptococcal bacteriuria 04/25/2016  . Urinary urgency 06/29/2015  . Redness of eye, right 06/29/2015  . Health maintenance examination 08/15/2014  . Low HDL (under 40) 08/15/2014  . Obesity, Class II, BMI 35-39.9, with comorbidity 08/15/2014  . Depression with anxiety 10/12/2013  . History of hypertension 01/20/2011  . MIGRAINE HEADACHE 04/28/2008  . Cervicalgia 03/09/2008  . History of HPV infection  05/21/2007    Amy Costa   PTA 07/22/2017, 5:44 PM  [facility] [address] [place], [place], [address] Phone: [phone]   Fax:  [phone]  Name: Amy Costa MRN: [id number] Date of Birth: 1980-06-22

## 2017-07-27 ENCOUNTER — Ambulatory Visit: Payer: BLUE CROSS/BLUE SHIELD | Attending: Orthopaedic Surgery | Admitting: Physical Therapy

## 2017-07-27 ENCOUNTER — Telehealth: Payer: Self-pay | Admitting: Orthopaedic Surgery

## 2017-07-27 ENCOUNTER — Encounter: Payer: Self-pay | Admitting: Physical Therapy

## 2017-07-27 DIAGNOSIS — M25622 Stiffness of left elbow, not elsewhere classified: Secondary | ICD-10-CM

## 2017-07-27 DIAGNOSIS — M25522 Pain in left elbow: Secondary | ICD-10-CM | POA: Diagnosis not present

## 2017-07-27 DIAGNOSIS — M6281 Muscle weakness (generalized): Secondary | ICD-10-CM | POA: Diagnosis not present

## 2017-07-27 NOTE — Telephone Encounter (Signed)
Patient called stating she needs someone to respond to her letter regarding her light duty restrictions.  Please call her asap to let her know that you responded to the letter.

## 2017-07-27 NOTE — Therapy (Signed)
[place], [place], [address] Phone: [phone]   Fax:  [phone]  Physical Therapy Treatment  Patient Details  Name: Amy Costa MRN: [id number] Date of Birth: 09/13/1980 Referring Provider: Dr. Joni Fears    Encounter Date: 07/27/2017  PT End of Session - 07/27/17 0921    Visit Number  3    Number of Visits  12    Date for PT Re-Evaluation  08/31/17    PT Start Time  0803    PT Stop Time  0843    PT Time Calculation (min)  40 min    Activity Tolerance  Patient tolerated treatment well    Behavior During Therapy  [facility] for tasks assessed/performed       Past Medical History:  Diagnosis Date  . Anxiety   . Depression    on zoloft for years  . HPV (human papilloma virus) infection   . Hypertension   . Migraine    Dr Nelva Bush at [facility] ortho  . Pre-eclampsia     Past Surgical History:  Procedure Laterality Date  . ARM WOUND REPAIR / CLOSURE    . CESAREAN SECTION  01/21/2012   Procedure: CESAREAN SECTION;  Surgeon: Margarette Asal, MD;  Location: [facility];  Service: Obstetrics;  Laterality: N/A;  . CESAREAN SECTION WITH BILATERAL TUBAL LIGATION Bilateral 09/09/2016   Procedure: CESAREAN SECTION WITH BILATERAL TUBAL LIGATION;  Surgeon: Molli Posey, MD;  Location: [place];  Service: Obstetrics;  Laterality: Bilateral;  Repeat edc 09/17/16 nkda Jill Side, RNFA  . MRI  2010   [facility] - showing bulging disc C5/6  . MYOMECTOMY  01/21/2012   Procedure: MYOMECTOMY;  Surgeon: Margarette Asal, MD;  Location: [place] [facility];  Service: Obstetrics;  Laterality: N/A;    There were no vitals filed for this visit.  Subjective Assessment - 07/27/17 0806    Subjective  Exerises getting a little better.  Woke me up several times.  6/10.      Currently in Pain?  Yes    Pain Score  6     Pain Location  Elbow    Pain Orientation  Left    Pain Descriptors / Indicators  Burning;Throbbing    Pain Type   Chronic pain    Pain Onset  More than a month ago    Pain Frequency  Constant          [facility] Adult PT Treatment/Exercise - 07/27/17 0001      Elbow Exercises   Forearm Supination  AROM;Self ROM;Left    Forearm Pronation  AROM;Self ROM;Left    Other elbow exercises  PROM wrist flex, ext       Wrist Exercises   Wrist Flexion  AROM;Left    Wrist Extension  AROM;Left    Wrist Radial Deviation  AROM;Left    Wrist Ulnar Deviation  AROM;Left      Cryotherapy   Number Minutes Cryotherapy  5 Minutes    Cryotherapy Location  Forearm    Type of Cryotherapy  Ice massage      Iontophoresis   Type of Iontophoresis  Dexamethasone    Location  lateral elbow    Dose  1cc, 4 mg/mL     Time  4-6 hour patch      Manual Therapy   Manual therapy comments  PROM, distraction, pin to supinators for improveing comfort with supination    Soft tissue mobilization  IASTM to L extensors and flexors  of wrist , lighter pressure used for comfort              PT Education - 07/27/17 0844    Education provided  Yes    Education Details  ice massage     Person(s) Educated  Patient    Methods  Explanation    Comprehension  Verbalized understanding          PT Long Term Goals - 07/20/17 1008      PT LONG TERM GOAL #1   Title  Pt will be able to score 30% or less on FOTO demonstrating improved functional mobility.     Time  6    Period  Weeks    Status  New    Target Date  08/31/17      PT LONG TERM GOAL #2   Title  Pt will be able to sleep on L side for part of the night (25%) due to less pain     Time  6    Period  Weeks    Status  New    Target Date  08/31/17      PT LONG TERM GOAL #3   Title  Pt will be able to improve grip on L UE by 10 lbs on LUE for improved safety and grip.      Time  6    Period  Weeks    Status  New    Target Date  08/31/17      PT LONG TERM GOAL #4   Title  Pt will be able to open doors with L hand with 50% less pain.     Time  6    Period   Weeks    Status  New    Target Date  08/31/17            Plan - 07/27/17 6962    Clinical Impression Statement  Patient reports an improvement when at rest, becoming more intermittent. cont with painful supination.  No goals met .     PT Treatment/Interventions  Iontophoresis 37m/ml Dexamethasone;ADLs/Self Care Home Management;Electrical Stimulation;Functional mobility training;Ultrasound;Passive range of motion;Manual techniques;Patient/family education;Therapeutic exercise;Taping;Scar mobilization;Therapeutic activities;Cryotherapy    PT Next Visit Plan  ROM , strength, manual and  assess ionto  , check HEP  again, give putty?    PT Home Exercise Plan  stretching to extensors, flexors  ( modified to be done resting on pillow), supination, ( does better with pulling supinator superior with motion)  pronation     Consulted and Agree with Plan of Care  Patient       Patient will benefit from skilled therapeutic intervention in order to improve the following deficits and impairments:  Decreased mobility, Increased fascial restricitons, Impaired flexibility, Impaired UE functional use, Pain, Decreased strength  Visit Diagnosis: Pain in left elbow  Stiffness of left elbow, not elsewhere classified  Muscle weakness (generalized)     Problem List Patient Active Problem List   Diagnosis Date Noted  . Pregnancy 09/09/2016  . Group B streptococcal bacteriuria 04/25/2016  . Urinary urgency 06/29/2015  . Redness of eye, right 06/29/2015  . Health maintenance examination 08/15/2014  . Low HDL (under 40) 08/15/2014  . Obesity, Class II, BMI 35-39.9, with comorbidity 08/15/2014  . Depression with anxiety 10/12/2013  . History of hypertension 01/20/2011  . MIGRAINE HEADACHE 04/28/2008  . Cervicalgia 03/09/2008  . History of HPV infection 05/21/2007    Amy Costa 07/27/2017, 9:22 AM  [place]  [place], [place],  [address] Phone: [phone]   Fax:  [phone]  Name: Amy Costa MRN: [id number] Date of Birth: Jul 10, 1980  Raeford Razor, PT 07/27/17 9:23 AM Phone: [phone] Fax: [phone]

## 2017-07-27 NOTE — Telephone Encounter (Signed)
Received fax from employer and sent in fax to [place] at [phone] from Dr. Durward Fortes to verify letter to

## 2017-07-29 ENCOUNTER — Ambulatory Visit: Payer: BLUE CROSS/BLUE SHIELD | Admitting: Physical Therapy

## 2017-07-29 ENCOUNTER — Encounter: Payer: Self-pay | Admitting: Physical Therapy

## 2017-07-29 DIAGNOSIS — M6281 Muscle weakness (generalized): Secondary | ICD-10-CM

## 2017-07-29 DIAGNOSIS — M25522 Pain in left elbow: Secondary | ICD-10-CM | POA: Diagnosis not present

## 2017-07-29 DIAGNOSIS — M25622 Stiffness of left elbow, not elsewhere classified: Secondary | ICD-10-CM | POA: Diagnosis not present

## 2017-07-29 NOTE — Therapy (Signed)
[place], [place], [address] Phone: [phone]   Fax:  [phone]  Physical Therapy Treatment  Patient Details  Name: Amy Costa MRN: [id number] Date of Birth: 14-May-1980 Referring Provider: Dr. Joni Fears    Encounter Date: 07/29/2017  PT End of Session - 07/29/17 1708    Visit Number  4    Number of Visits  12    Date for PT Re-Evaluation  08/31/17    PT Start Time  0733    PT Stop Time  0759    PT Time Calculation (min)  26 min    Activity Tolerance  Patient tolerated treatment well    Behavior During Therapy  [facility] for tasks assessed/performed       Past Medical History:  Diagnosis Date  . Anxiety   . Depression    on zoloft for years  . HPV (human papilloma virus) infection   . Hypertension   . Migraine    Dr Nelva Bush at [facility] ortho  . Pre-eclampsia     Past Surgical History:  Procedure Laterality Date  . ARM WOUND REPAIR / CLOSURE    . CESAREAN SECTION  01/21/2012   Procedure: CESAREAN SECTION;  Surgeon: Margarette Asal, MD;  Location: [facility];  Service: Obstetrics;  Laterality: N/A;  . CESAREAN SECTION WITH BILATERAL TUBAL LIGATION Bilateral 09/09/2016   Procedure: CESAREAN SECTION WITH BILATERAL TUBAL LIGATION;  Surgeon: Molli Posey, MD;  Location: [place];  Service: Obstetrics;  Laterality: Bilateral;  Repeat edc 09/17/16 nkda Jill Side, RNFA  . MRI  2010   [facility] - showing bulging disc C5/6  . MYOMECTOMY  01/21/2012   Procedure: MYOMECTOMY;  Surgeon: Margarette Asal, MD;  Location: [facility];  Service: Obstetrics;  Laterality: N/A;    There were no vitals filed for this visit.  Subjective Assessment - 07/29/17 0736    Subjective  Has been sore since last visit.  The massage felt good but still painful.  (Pended)     Currently in Pain?  Yes  (Pended)     Pain Score  4   (Pended)  Yesterday 7/10                       [facility] Adult PT  Treatment/Exercise - 07/29/17 0001      Self-Care   Self-Care  RICE;Other Self-Care Comments really try to rest elbow a few days,  do ex only if no pain    Other Self-Care Comments   Ways to exercise pain free,  helpfulness of resting arm ,       Elbow Exercises   Other elbow exercises  shoulder retraction 10 X , and shoulder rolls X 10 for posture correction.       Ultrasound   Ultrasound Location  elbow    Ultrasound Parameters  50%,  1.5 WATTS /CM2,  8 MINUTES    Ultrasound Goals  Pain      Iontophoresis   Type of Iontophoresis  Dexamethasone    Location  lateral elbow    Dose  1 cc, 4 Mg/ ml    Time  4-6 hour patch             PT Education - 07/29/17 1708    Education provided  Yes    Education Details  Pain control    Person(s) Educated  Patient    Methods  Explanation    Comprehension  Verbalized understanding  PT Long Term Goals - 07/20/17 1008      PT LONG TERM GOAL #1   Title  Pt will be able to score 30% or less on FOTO demonstrating improved functional mobility.     Time  6    Period  Weeks    Status  New    Target Date  08/31/17      PT LONG TERM GOAL #2   Title  Pt will be able to sleep on L side for part of the night (25%) due to less pain     Time  6    Period  Weeks    Status  New    Target Date  08/31/17      PT LONG TERM GOAL #3   Title  Pt will be able to improve grip on L UE by 10 lbs on LUE for improved safety and grip.      Time  6    Period  Weeks    Status  New    Target Date  08/31/17      PT LONG TERM GOAL #4   Title  Pt will be able to open doors with L hand with 50% less pain.     Time  6    Period  Weeks    Status  New    Target Date  08/31/17            Plan - 07/29/17 1709    Clinical Impression Statement  Pain flare since last session.  It felt good while she was here,  then increased.  Patient continued to do exercise with pain also making things worse.  So trial of modalities and decreased  exercisesto  se if things will calm down over the weekend.  She said she actually dropped the baby on the dog prior to going to MD for elbow pain.     PT Next Visit Plan  assess decreased activity,  pain better?  [place] helpful?  Continue ionto?  Putty?  return to ex/ manual.    PT Home Exercise Plan  stretching to extensors, flexors  ( modified to be done resting on pillow), supination, ( does better with pulling supinator superior with motion)  pronation     Consulted and Agree with Plan of Care  Patient       Patient will benefit from skilled therapeutic intervention in order to improve the following deficits and impairments:     Visit Diagnosis: Pain in left elbow  Stiffness of left elbow, not elsewhere classified  Muscle weakness (generalized)     Problem List Patient Active Problem List   Diagnosis Date Noted  . Pregnancy 09/09/2016  . Group B streptococcal bacteriuria 04/25/2016  . Urinary urgency 06/29/2015  . Redness of eye, right 06/29/2015  . Health maintenance examination 08/15/2014  . Low HDL (under 40) 08/15/2014  . Obesity, Class II, BMI 35-39.9, with comorbidity 08/15/2014  . Depression with anxiety 10/12/2013  . History of hypertension 01/20/2011  . MIGRAINE HEADACHE 04/28/2008  . Cervicalgia 03/09/2008  . History of HPV infection 05/21/2007    Mellody Masri PTA 07/29/2017, 5:17 PM  [facility] [address] [place], [place], [address] Phone: [phone]   Fax:  [phone]  Name: Amy Costa MRN: [id number] Date of Birth: November 04, 1980

## 2017-07-29 NOTE — Patient Instructions (Signed)
Hold paINFUL EXERCISES A FEW DAYS  To see if helpful.  Must stretch  Gently.

## 2017-07-31 ENCOUNTER — Telehealth (INDEPENDENT_AMBULATORY_CARE_PROVIDER_SITE_OTHER): Payer: Self-pay | Admitting: Orthopaedic Surgery

## 2017-07-31 ENCOUNTER — Other Ambulatory Visit (INDEPENDENT_AMBULATORY_CARE_PROVIDER_SITE_OTHER): Payer: Self-pay | Admitting: Radiology

## 2017-07-31 MED ORDER — GABAPENTIN 100 MG PO CAPS: 100.0000 mg | ORAL_CAPSULE | Freq: Three times a day (TID) | ORAL | 0 refills | Status: DC

## 2017-07-31 NOTE — Telephone Encounter (Signed)
Patient left a voicemail stating that her pharmacy [facility] in [place] sent in a request for refill of Gabapentin and it hasn't been refilled yet.  Patient states she has been without her medication for 4 days.

## 2017-07-31 NOTE — Telephone Encounter (Signed)
Can we refill her meds

## 2017-07-31 NOTE — Telephone Encounter (Signed)
Faxed meds to pharmacy and called pt to notify it was called in.

## 2017-07-31 NOTE — Telephone Encounter (Signed)
Ok to renew?  

## 2017-08-03 ENCOUNTER — Encounter: Payer: Self-pay | Admitting: Physical Therapy

## 2017-08-03 ENCOUNTER — Ambulatory Visit: Payer: BLUE CROSS/BLUE SHIELD | Admitting: Physical Therapy

## 2017-08-03 DIAGNOSIS — M25622 Stiffness of left elbow, not elsewhere classified: Secondary | ICD-10-CM

## 2017-08-03 DIAGNOSIS — M6281 Muscle weakness (generalized): Secondary | ICD-10-CM | POA: Diagnosis not present

## 2017-08-03 DIAGNOSIS — M25522 Pain in left elbow: Secondary | ICD-10-CM | POA: Diagnosis not present

## 2017-08-03 NOTE — Patient Instructions (Signed)
Issued from ex drawer: Cane abduction and flexion shoulder 1-2 X a day  3-5 x each Hold 1 second

## 2017-08-03 NOTE — Therapy (Signed)
[place], [place], [address] Phone: [phone]   Fax:  [phone]  Physical Therapy Treatment  Patient Details  Name: Amy Costa MRN: [id number] Date of Birth: 1980/06/04 Referring Provider: Dr. Joni Fears    Encounter Date: 08/03/2017  PT End of Session - 08/03/17 0821    Visit Number  5    Number of Visits  12    Date for PT Re-Evaluation  08/31/17    PT Start Time  0732 30 minute appointment    PT Stop Time  0818    PT Time Calculation (min)  46 min    Activity Tolerance  Patient tolerated treatment well       Past Medical History:  Diagnosis Date  . Anxiety   . Depression    on zoloft for years  . HPV (human papilloma virus) infection   . Hypertension   . Migraine    Dr Nelva Bush at [facility] ortho  . Pre-eclampsia     Past Surgical History:  Procedure Laterality Date  . ARM WOUND REPAIR / CLOSURE    . CESAREAN SECTION  01/21/2012   Procedure: CESAREAN SECTION;  Surgeon: Margarette Asal, MD;  Location: [facility];  Service: Obstetrics;  Laterality: N/A;  . CESAREAN SECTION WITH BILATERAL TUBAL LIGATION Bilateral 09/09/2016   Procedure: CESAREAN SECTION WITH BILATERAL TUBAL LIGATION;  Surgeon: Molli Posey, MD;  Location: [facility];  Service: Obstetrics;  Laterality: Bilateral;  Repeat edc 09/17/16 nkda Jill Side, RNFA  . MRI  2010   [facility] - showing bulging disc C5/6  . MYOMECTOMY  01/21/2012   Procedure: MYOMECTOMY;  Surgeon: Margarette Asal, MD;  Location: [facility];  Service: Obstetrics;  Laterality: N/A;    There were no vitals filed for this visit.  Subjective Assessment - 08/03/17 0809    Subjective  nO PAIN THIS MORNING AT REST.    She was able to rest arm as much as possible over weekend and now pain is intermittant.    Pain Score  0-No pain up to 6/10    Pain Location  Elbow    Pain Orientation  Left    Pain Descriptors / Indicators  Aching;Shooting Aching  posterior,  shooting anterior    Pain Radiating Towards  upper arm to shoulder and to wrist    Pain Frequency  Intermittent    Aggravating Factors   reaching behind,  reaching .  using arm to put deodorant on opposite, Letting arm hang    Pain Relieving Factors  rest Rice,  Ionto    Effect of Pain on Daily Activities  limits sleeping and use of arm    Multiple Pain Sites  No         [facility] PT Assessment - 08/03/17 0001      Strength   Overall Strength Comments  23 AVERAGE 23, 26, 22  lbs,  UNCOMFORTABLE                   [facility] Adult PT Treatment/Exercise - 08/03/17 0001      Elbow Exercises   Elbow Flexion  -- 3 reps PROm care taken to avoid pain    Elbow Extension  -- 3 reps PROM  care taken to avoid pain      Shoulder Exercises: Seated   Flexion  5 reps;AROM Cane AA 2 reps for HEP    Abduction  5 reps    ABduction Limitations  Cane 5 X  pain increased to 4/10      Wrist Exercises   Wrist Flexion  5 reps;PROM    Wrist Radial Deviation  -- 3 reps PROM    Wrist Ulnar Deviation  -- 3 reps PROM      Cryotherapy   Number Minutes Cryotherapy  10 Minutes    Cryotherapy Location  -- elbow    Type of Cryotherapy  -- cold pack      Ultrasound   Ultrasound Location  elbow    Ultrasound Parameters  100%,  1.2 watts /cm2,  contiinuous    Ultrasound Goals  Pain      Iontophoresis   Type of Iontophoresis  Dexamethasone    Location  lateral elbow    Dose  1cc,  4 mg/ml     Time  4-6 hour patch      Manual Therapy   Manual therapy comments  demo how to apply tape,  kinesiotex tape for edema,  she will do when patch comes off.              PT Education - 08/03/17 [phone]    Education provided  Yes    Education Details  HEP,  how to tape    Person(s) Educated  Patient    Methods  Explanation;Demonstration;Verbal cues    Comprehension  Verbalized understanding          PT Long Term Goals - 08/03/17 0825      PT LONG TERM GOAL #1   Title  Pt will be able  to score 30% or less on FOTO demonstrating improved functional mobility.     Time  6    Period  Weeks    Status  Unable to assess      PT LONG TERM GOAL #2   Title  Pt will be able to sleep on L side for part of the night (25%) due to less pain     Baseline  10 minutes with pain    Time  6    Period  Weeks    Status  On-going      PT LONG TERM GOAL #3   Title  Pt will be able to improve grip on L UE by 10 lbs on LUE for improved safety and grip.      Baseline  22 LBS average,  weaker  mild discomfort    Time  6    Period  Weeks    Status  On-going      PT LONG TERM GOAL #4   Title  Pt will be able to open doors with L hand with 50% less pain.     Baseline  can push / pulldoors with discomfort,  avoids door knobs    Time  6    Period  Weeks    Status  On-going            Plan - 08/03/17 3818    Clinical Impression Statement  Pain intermittant with weekend rest.  She can sleep on left side 10 minuts with pain,  She can push or pull a door open with pain,  she avoids door knobs  .  Shoulder is getting stiff.  Grip average strength 22 LBS (weaker)Pain at end of session 4/10 pAIN PRIOR TO COLD PACK    PT Next Visit Plan  Continue [place], Ionto.  Assess tape for edema that patient applied herself.  Shoulder ROM,  elbow exercises as able.      PT Home  Exercise Plan  stretching to extensors, flexors  ( modified to be done resting on pillow), supination, ( does better with pulling supinator superior with motion)  pronation  Cane flexion and abduction  for shoulder.     Consulted and Agree with Plan of Care  Patient       Patient will benefit from skilled therapeutic intervention in order to improve the following deficits and impairments:     Visit Diagnosis: Pain in left elbow  Stiffness of left elbow, not elsewhere classified  Muscle weakness (generalized)     Problem List Patient Active Problem List   Diagnosis Date Noted  . Pregnancy 09/09/2016  . Group B streptococcal  bacteriuria 04/25/2016  . Urinary urgency 06/29/2015  . Redness of eye, right 06/29/2015  . Health maintenance examination 08/15/2014  . Low HDL (under 40) 08/15/2014  . Obesity, Class II, BMI 35-39.9, with comorbidity 08/15/2014  . Depression with anxiety 10/12/2013  . History of hypertension 01/20/2011  . MIGRAINE HEADACHE 04/28/2008  . Cervicalgia 03/09/2008  . History of HPV infection 05/21/2007    Laderius Valbuena PTA 08/03/2017, 8:28 AM  [facility] [address] [place], [place], [address] Phone: [phone]   Fax:  [phone]  Name: Amy Costa MRN: [id number] Date of Birth: 13-Nov-1980

## 2017-08-05 ENCOUNTER — Encounter: Payer: Self-pay | Admitting: Physical Therapy

## 2017-08-05 ENCOUNTER — Ambulatory Visit: Payer: BLUE CROSS/BLUE SHIELD | Admitting: Physical Therapy

## 2017-08-05 DIAGNOSIS — M25622 Stiffness of left elbow, not elsewhere classified: Secondary | ICD-10-CM | POA: Diagnosis not present

## 2017-08-05 DIAGNOSIS — M6281 Muscle weakness (generalized): Secondary | ICD-10-CM

## 2017-08-05 DIAGNOSIS — M25522 Pain in left elbow: Secondary | ICD-10-CM

## 2017-08-05 NOTE — Therapy (Signed)
[place], [place], [address] Phone: [phone]   Fax:  [phone]  Physical Therapy Treatment  Patient Details  Name: Amy Costa MRN: [id number] Date of Birth: 06-May-1980 Referring Provider: Dr. Joni Fears    Encounter Date: 08/05/2017  PT End of Session - 08/05/17 1729    Visit Number  6    Number of Visits  12    Date for PT Re-Evaluation  08/31/17    PT Start Time  1631    PT Stop Time  1717    PT Time Calculation (min)  46 min    Activity Tolerance  Patient tolerated treatment well    Behavior During Therapy  [facility] for tasks assessed/performed       Past Medical History:  Diagnosis Date  . Anxiety   . Depression    on zoloft for years  . HPV (human papilloma virus) infection   . Hypertension   . Migraine    Dr Nelva Bush at [facility] ortho  . Pre-eclampsia     Past Surgical History:  Procedure Laterality Date  . ARM WOUND REPAIR / CLOSURE    . CESAREAN SECTION  01/21/2012   Procedure: CESAREAN SECTION;  Surgeon: Margarette Asal, MD;  Location: [facility];  Service: Obstetrics;  Laterality: N/A;  . CESAREAN SECTION WITH BILATERAL TUBAL LIGATION Bilateral 09/09/2016   Procedure: CESAREAN SECTION WITH BILATERAL TUBAL LIGATION;  Surgeon: Molli Posey, MD;  Location: [facility];  Service: Obstetrics;  Laterality: Bilateral;  Repeat edc 09/17/16 nkda Jill Side, RNFA  . MRI  2010   [facility] - showing bulging disc C5/6  . MYOMECTOMY  01/21/2012   Procedure: MYOMECTOMY;  Surgeon: Margarette Asal, MD;  Location: [facility];  Service: Obstetrics;  Laterality: N/A;    There were no vitals filed for this visit.  Subjective Assessment - 08/05/17 1639    Subjective  Less throbbing.  Constant 4/10.   Gets warm  especially after stretching.  patient was able to rest yesterday.   Tape no real difference.   Able to straighten elbow  with less pain.    Currently in Pain?  Yes    Pain Score  4     Pain Location  Elbow    Pain Orientation  Left    Pain Descriptors / Indicators  Aching;Shooting    Pain Radiating Towards  upper arm and wrist    Pain Frequency  Constant    Pain Relieving Factors  rest ice ionto.  Holding elbow for extension.    Effect of Pain on Daily Activities  limits sleeping and use of arm    Multiple Pain Sites  No         [facility] PT Assessment - 08/05/17 0001      AROM   AROM Assessment Site  -- -10 extension with pain 6/10                   [facility] Adult PT Treatment/Exercise - 08/05/17 0001      Self-Care   Other Self-Care Comments   How to decrease edema      Ultrasound   Ultrasound Location  elbow medial/leteral    Ultrasound Parameters  50% 1.2 watts /cm2 8 minutes    Ultrasound Goals  Pain      Iontophoresis   Type of Iontophoresis  Dexamethasone    Location  lateral elbow    Dose  1cc, 4 mg/ml    Time  4-6 hour patch      Manual Therapy   Soft tissue mobilization  retrograde soft tissue work starting proximal arm at axilla,  tissue softened               PT Education - 08/05/17 1729    Education Details  how to reduce edema    Person(s) Educated  Patient    Methods  Explanation;Demonstration    Comprehension  Verbalized understanding          PT Long Term Goals - 08/03/17 0825      PT LONG TERM GOAL #1   Title  Pt will be able to score 30% or less on FOTO demonstrating improved functional mobility.     Time  6    Period  Weeks    Status  Unable to assess      PT LONG TERM GOAL #2   Title  Pt will be able to sleep on L side for part of the night (25%) due to less pain     Baseline  10 minutes with pain    Time  6    Period  Weeks    Status  On-going      PT LONG TERM GOAL #3   Title  Pt will be able to improve grip on L UE by 10 lbs on LUE for improved safety and grip.      Baseline  22 LBS average,  weaker  mild discomfort    Time  6    Period  Weeks    Status  On-going      PT LONG TERM GOAL #4    Title  Pt will be able to open doors with L hand with 50% less pain.     Baseline  can push / pulldoors with discomfort,  avoids door knobs    Time  6    Period  Weeks    Status  On-going            Plan - 08/05/17 1730    Clinical Impression Statement  Pain at end of session 2-3/10.  Slow gains with pain.  Able to soften tissue with manual.  girth 1/10 of cm different at joint line however visiably larger left proximal arm. AROM elbow extension -10 degrees,   is less painful.    PT Next Visit Plan  Assess manual Continue, Ionto.   Shoulder ROM,  elbow exercises as able.  Check specific goals    PT Home Exercise Plan  stretching to extensors, flexors  ( modified to be done resting on pillow), supination, ( does better with pulling supinator superior with motion)  pronation  Cane flexion and abduction  for shoulder.     Consulted and Agree with Plan of Care  Patient       Patient will benefit from skilled therapeutic intervention in order to improve the following deficits and impairments:     Visit Diagnosis: Pain in left elbow  Stiffness of left elbow, not elsewhere classified  Muscle weakness (generalized)     Problem List Patient Active Problem List   Diagnosis Date Noted  . Pregnancy 09/09/2016  . Group B streptococcal bacteriuria 04/25/2016  . Urinary urgency 06/29/2015  . Redness of eye, right 06/29/2015  . Health maintenance examination 08/15/2014  . Low HDL (under 40) 08/15/2014  . Obesity, Class II, BMI 35-39.9, with comorbidity 08/15/2014  . Depression with anxiety 10/12/2013  . History of hypertension 01/20/2011  . MIGRAINE HEADACHE 04/28/2008  .  Cervicalgia 03/09/2008  . History of HPV infection 05/21/2007    Kea Callan PTA 08/05/2017, 5:36 PM  [facility] [address] [place], [place], [address] Phone: [phone]   Fax:  [phone]  Name: OGECHI KUEHNEL MRN: [id number] Date of Birth:  Sep 15, 1980

## 2017-08-10 ENCOUNTER — Encounter: Payer: Self-pay | Admitting: Physical Therapy

## 2017-08-10 ENCOUNTER — Ambulatory Visit: Payer: BLUE CROSS/BLUE SHIELD | Admitting: Physical Therapy

## 2017-08-10 DIAGNOSIS — M6281 Muscle weakness (generalized): Secondary | ICD-10-CM

## 2017-08-10 DIAGNOSIS — M25522 Pain in left elbow: Secondary | ICD-10-CM

## 2017-08-10 DIAGNOSIS — M25622 Stiffness of left elbow, not elsewhere classified: Secondary | ICD-10-CM | POA: Diagnosis not present

## 2017-08-10 NOTE — Therapy (Signed)
[place], [place], [address] Phone: [phone]   Fax:  [phone]  Physical Therapy Treatment  Patient Details  Name: Amy Costa MRN: [id number] Date of Birth: 1981/04/02 Referring Provider: Dr. Joni Fears    Encounter Date: 08/10/2017  PT End of Session - 08/10/17 1005    Visit Number  7    Number of Visits  12    Date for PT Re-Evaluation  08/31/17    PT Start Time  0802    PT Stop Time  0845    PT Time Calculation (min)  43 min    Activity Tolerance  Patient tolerated treatment well    Behavior During Therapy  [facility] for tasks assessed/performed       Past Medical History:  Diagnosis Date  . Anxiety   . Depression    on zoloft for years  . HPV (human papilloma virus) infection   . Hypertension   . Migraine    Dr Nelva Bush at [facility] ortho  . Pre-eclampsia     Past Surgical History:  Procedure Laterality Date  . ARM WOUND REPAIR / CLOSURE    . CESAREAN SECTION  01/21/2012   Procedure: CESAREAN SECTION;  Surgeon: Margarette Asal, MD;  Location: [facility];  Service: Obstetrics;  Laterality: N/A;  . CESAREAN SECTION WITH BILATERAL TUBAL LIGATION Bilateral 09/09/2016   Procedure: CESAREAN SECTION WITH BILATERAL TUBAL LIGATION;  Surgeon: Molli Posey, MD;  Location: [facility];  Service: Obstetrics;  Laterality: Bilateral;  Repeat edc 09/17/16 nkda Jill Side, RNFA  . MRI  2010   [facility] - showing bulging disc C5/6  . MYOMECTOMY  01/21/2012   Procedure: MYOMECTOMY;  Surgeon: Margarette Asal, MD;  Location: [facility];  Service: Obstetrics;  Laterality: N/A;    There were no vitals filed for this visit.  Subjective Assessment - 08/10/17 0805    Subjective  Stiffness, feels swollen.  3/10 this AM. Some good days and some bad days.  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  Yes    Pain Score  3     Pain Location  Elbow    Pain Orientation  Left    Pain Descriptors / Indicators  Tightness    Pain Type  Chronic pain    Pain Onset  More than a month ago    Pain Frequency  Intermittent           [facility] Adult PT Treatment/Exercise - 08/10/17 0001      Elbow Exercises   Elbow Flexion  Strengthening;Left;10 reps    Elbow Extension  Strengthening;Left;10 reps    Forearm Supination  AROM;Left    Forearm Pronation  AROM;Left    Other elbow exercises  used 1 lbs wgt for feedback       Shoulder Exercises: Supine   Horizontal ABduction  AAROM;Both;12 reps    External Rotation  AAROM;Left;10 reps    Flexion  AAROM;Both;10 reps      Ultrasound   Ultrasound Goals  Pain      Iontophoresis   Type of Iontophoresis  Dexamethasone    Location  medial elbow     Dose  1cc, 4 mg/ml  Time  4-6 hour patch      Manual Therapy   Soft tissue mobilization  forearm supinator, lateral and medial aspect of elbow              PT Education - 08/10/17 1005    Education provided  No          PT Long Term Goals - 08/10/17 0843      PT LONG TERM GOAL #1   Title  Pt will be able to score 30% or less on FOTO demonstrating improved functional mobility.     Status  Unable to assess      PT LONG TERM GOAL #2   Title  Pt will be able to sleep on L side for part of the night (25%) due to less pain     Baseline  unable     Status  On-going      PT LONG TERM GOAL #3   Title  Pt will be able to improve grip on L UE by 10 lbs on LUE for improved safety and grip.      Status  On-going      PT LONG TERM GOAL #4   Title  Pt will be able to open doors with L hand with 50% less pain.     Status  On-going      PT LONG TERM GOAL #5   Title  Pt will be able to reach across  her body for ADLs, hygiene with 25% less pain.     Time  3    Period  Weeks    Status  New    Target Date  08/31/17            Plan - 08/10/17 1005    Clinical Impression Statement  Sees MD 08/17/17. Patient unable to supinate/pronate with elbow extended without severe pain.  Has trouble reaching across her body for any task.  Overall pain is becoming more intermittent but still quite limited in what she can do.     PT Next Visit Plan  Consider UE ranger.  Assess manual Continue, Ionto.   Shoulder ROM,  elbow exercises as able.      PT Home Exercise Plan  stretching to extensors, flexors  ( modified to be done resting on pillow), supination, ( does better with pulling supinator superior with motion)  pronation  Cane flexion and abduction  for shoulder.     Consulted and Agree with Plan of Care  Patient       Patient will benefit from skilled therapeutic intervention in order to improve the following deficits and impairments:  Decreased mobility, Increased fascial restricitons, Impaired flexibility, Impaired UE functional use, Pain, Decreased strength  Visit Diagnosis: Pain in left elbow  Stiffness of left elbow, not elsewhere classified  Muscle weakness (generalized)     Problem List Patient Active Problem List   Diagnosis Date Noted  . Pregnancy 09/09/2016  . Group B streptococcal bacteriuria 04/25/2016  . Urinary urgency 06/29/2015  . Redness of eye, right 06/29/2015  . Health maintenance examination 08/15/2014  . Low HDL (under 40) 08/15/2014  . Obesity, Class II, BMI 35-39.9, with comorbidity 08/15/2014  . Depression with anxiety 10/12/2013  . History of hypertension 01/20/2011  . MIGRAINE HEADACHE 04/28/2008  . Cervicalgia 03/09/2008  . History of HPV infection 05/21/2007    Velmer Broadfoot 08/10/2017, 10:12 AM  [facility] [address] [place], [place], [address] Phone: [phone]   Fax:  [phone]  Name: Amy Costa MRN: [id number] Date of Birth: 1981/01/01  Raeford Razor, PT 08/10/17 10:13 AM Phone: [phone] Fax: [phone]

## 2017-08-12 ENCOUNTER — Ambulatory Visit: Payer: BLUE CROSS/BLUE SHIELD | Admitting: Physical Therapy

## 2017-08-12 ENCOUNTER — Encounter: Payer: Self-pay | Admitting: Physical Therapy

## 2017-08-12 DIAGNOSIS — M6281 Muscle weakness (generalized): Secondary | ICD-10-CM | POA: Diagnosis not present

## 2017-08-12 DIAGNOSIS — M25622 Stiffness of left elbow, not elsewhere classified: Secondary | ICD-10-CM

## 2017-08-12 DIAGNOSIS — M25522 Pain in left elbow: Secondary | ICD-10-CM | POA: Diagnosis not present

## 2017-08-12 NOTE — Therapy (Addendum)
[place], [place], [address] Phone: [phone]   Fax:  [phone]  Physical Therapy Treatment/Discharge  Patient Details  Name: Amy Costa MRN: [id number] Date of Birth: 02-19-1981 Referring Provider: Dr. Joni Fears    Encounter Date: 08/12/2017  PT End of Session - 08/12/17 1542    Visit Number  8    Number of Visits  12    Date for PT Re-Evaluation  08/31/17    PT Start Time  0733    PT Stop Time  0800    PT Time Calculation (min)  27 min    Activity Tolerance  Patient tolerated treatment well    Behavior During Therapy  [facility] for tasks assessed/performed       Past Medical History:  Diagnosis Date  . Anxiety   . Depression    on zoloft for years  . HPV (human papilloma virus) infection   . Hypertension   . Migraine    Dr Nelva Bush at [facility] ortho  . Pre-eclampsia     Past Surgical History:  Procedure Laterality Date  . ARM WOUND REPAIR / CLOSURE    . CESAREAN SECTION  01/21/2012   Procedure: CESAREAN SECTION;  Surgeon: Margarette Asal, MD;  Location: [facility];  Service: Obstetrics;  Laterality: N/A;  . CESAREAN SECTION WITH BILATERAL TUBAL LIGATION Bilateral 09/09/2016   Procedure: CESAREAN SECTION WITH BILATERAL TUBAL LIGATION;  Surgeon: Molli Posey, MD;  Location: [facility];  Service: Obstetrics;  Laterality: Bilateral;  Repeat edc 09/17/16 nkda Jill Side, RNFA  . MRI  2010   [facility] - showing bulging disc C5/6  . MYOMECTOMY  01/21/2012   Procedure: MYOMECTOMY;  Surgeon: Margarette Asal, MD;  Location: [facility];  Service: Obstetrics;  Laterality: N/A;    There were no vitals filed for this visit.  Subjective Assessment - 08/12/17 1325    Subjective  I am doing OK.   I am a little sore.  The patch on other side helped.    Currently in Pain?  Yes    Pain Score  3     Pain Location  Elbow    Pain Orientation  Left    Pain Descriptors / Indicators  Sore    Pain Type   Chronic pain    Aggravating Factors   using arm reaching behing opening door    Pain Relieving Factors  rest Ice,  ionto    Effect of Pain on Daily Activities  limits use of arm    Multiple Pain Sites  No                       [facility] Adult PT Treatment/Exercise - 08/12/17 0001      Shoulder Exercises: Seated   Flexion  AAROM;10 reps cane   Good range    Abduction  10 reps;AAROM cane  Good range    Other Seated Exercises  UE ranger shoulder mobility various directions.  able to move arm with less pain/     Other Seated Exercises  supination pronation AROM cued for pain free range.        Wrist Exercises   Other wrist exercises  open close hand      Iontophoresis   Type of Iontophoresis  Dexamethasone    Location  medial elbow    Dose  1cc 62m/ml    Time  4-6 hour patch      Manual Therapy  Manual therapy comments  retrograde starting deltiod. light             PT Education - 08/12/17 1542    Education provided  Yes    Education Details  exercise form    Person(s) Educated  Patient    Methods  Explanation;Demonstration    Comprehension  Verbalized understanding;Returned demonstration          PT Long Term Goals - 08/10/17 0843      PT LONG TERM GOAL #1   Title  Pt will be able to score 30% or less on FOTO demonstrating improved functional mobility.     Status  Unable to assess      PT LONG TERM GOAL #2   Title  Pt will be able to sleep on L side for part of the night (25%) due to less pain     Baseline  unable     Status  On-going      PT LONG TERM GOAL #3   Title  Pt will be able to improve grip on L UE by 10 lbs on LUE for improved safety and grip.      Status  On-going      PT LONG TERM GOAL #4   Title  Pt will be able to open doors with L hand with 50% less pain.     Status  On-going      PT LONG TERM GOAL #5   Title  Pt will be able to reach across her body for ADLs, hygiene with 25% less pain.     Time  3    Period  Weeks     Status  New    Target Date  08/31/17            Plan - 08/12/17 1543    Clinical Impression Statement  paine may be improving.  Shoulder Flaxion and Abduction with cane now WNL and less stiff.  Ionto patch medial elbow helpful.    PT Next Visit Plan  Continue UE ranger.  Assess manual Continue, Ionto.   Shoulder ROM,  elbow exercises as able.      PT Home Exercise Plan  stretching to extensors, flexors  ( modified to be done resting on pillow), supination, ( does better with pulling supinator superior with motion)  pronation  Cane flexion and abduction  for shoulder.     Consulted and Agree with Plan of Care  Patient       Patient will benefit from skilled therapeutic intervention in order to improve the following deficits and impairments:     Visit Diagnosis: Pain in left elbow  Stiffness of left elbow, not elsewhere classified  Muscle weakness (generalized)     Problem List Patient Active Problem List   Diagnosis Date Noted  . Pregnancy 09/09/2016  . Group B streptococcal bacteriuria 04/25/2016  . Urinary urgency 06/29/2015  . Redness of eye, right 06/29/2015  . Health maintenance examination 08/15/2014  . Low HDL (under 40) 08/15/2014  . Obesity, Class II, BMI 35-39.9, with comorbidity 08/15/2014  . Depression with anxiety 10/12/2013  . History of hypertension 01/20/2011  . MIGRAINE HEADACHE 04/28/2008  . Cervicalgia 03/09/2008  . History of HPV infection 05/21/2007    HARRIS,KAREN  PTA 08/12/2017, 3:45 PM  [facility] [address] [place], [place], [address] Phone: [phone]   Fax:  [phone]  Name: Amy Costa MRN: [id number] Date of Birth: 15-Dec-1980  PHYSICAL THERAPY DISCHARGE SUMMARY  Visits from Start of Care: 8  Current functional level related to goals / functional outcomes: See above for most recent    Remaining deficits: Unknown   Education / Equipment: Pain control,self care,  RICE  Plan: Patient agrees to discharge.  Patient goals were not met. Patient is being discharged due to not returning since the last visit.  ?????    Raeford Razor, PT 10/30/17 10:03 AM Phone: [phone] Fax: [phone]

## 2017-08-17 ENCOUNTER — Other Ambulatory Visit (INDEPENDENT_AMBULATORY_CARE_PROVIDER_SITE_OTHER): Payer: Self-pay | Admitting: Radiology

## 2017-08-17 ENCOUNTER — Other Ambulatory Visit (INDEPENDENT_AMBULATORY_CARE_PROVIDER_SITE_OTHER): Payer: Self-pay | Admitting: *Deleted

## 2017-08-17 ENCOUNTER — Encounter (INDEPENDENT_AMBULATORY_CARE_PROVIDER_SITE_OTHER): Payer: Self-pay | Admitting: Radiology

## 2017-08-17 ENCOUNTER — Ambulatory Visit (INDEPENDENT_AMBULATORY_CARE_PROVIDER_SITE_OTHER): Payer: BLUE CROSS/BLUE SHIELD | Admitting: Orthopaedic Surgery

## 2017-08-17 ENCOUNTER — Encounter (INDEPENDENT_AMBULATORY_CARE_PROVIDER_SITE_OTHER): Payer: Self-pay | Admitting: Orthopaedic Surgery

## 2017-08-17 VITALS — BP 154/94 | HR 71 | Resp 18 | Ht 70.0 in | Wt 270.0 lb

## 2017-08-17 DIAGNOSIS — L218 Other seborrheic dermatitis: Secondary | ICD-10-CM | POA: Diagnosis not present

## 2017-08-17 DIAGNOSIS — M25522 Pain in left elbow: Secondary | ICD-10-CM | POA: Diagnosis not present

## 2017-08-17 MED ORDER — LIDOCAINE HCL 1 % IJ SOLN
1.0000 mL | INTRAMUSCULAR | Status: AC | PRN
  Administered 2017-08-17: 1 mL

## 2017-08-17 MED ORDER — METHYLPREDNISOLONE ACETATE 40 MG/ML IJ SUSP
40.0000 mg | INTRAMUSCULAR | Status: AC | PRN
  Administered 2017-08-17: 40 mg

## 2017-08-17 MED ORDER — BUPIVACAINE HCL 0.5 % IJ SOLN
1.0000 mL | INTRAMUSCULAR | Status: AC | PRN
  Administered 2017-08-17: 1 mL

## 2017-08-17 MED ORDER — GABAPENTIN 100 MG PO CAPS
100.0000 mg | ORAL_CAPSULE | Freq: Three times a day (TID) | ORAL | 0 refills | Status: DC
Start: 2017-08-17 — End: 2017-09-15

## 2017-08-17 MED ORDER — LIDOCAINE HCL (PF) 1 % IJ SOLN
1.0000 mL | INTRAMUSCULAR | Status: AC | PRN
  Administered 2017-08-17: 1 mL

## 2017-08-17 NOTE — Progress Notes (Signed)
Office Visit Note   Patient: Amy Costa           Date of Birth: 02-18-1981           MRN: [id number] Visit Date: 08/17/2017              Requested by: Ria Bush, MD [address] [place], [place] [address] PCP: Ria Bush, MD   Assessment & Plan: Visit Diagnoses:  1. Pain in left elbow     Pain in left elbow persists although somewhat better.  Has been to physical therapy.  Still having pain out of proportion of what I would expect.  Having some tenderness along the medial and lateral upper condyle with some referred pain proximally.  No numbness or tingling.  Will continue with the gabapentin and increase the dose to 600 mg a day.  Inject the most symptomatic medial epicondyles.  Continue light duty work for 1 month.  Office 1 month.  Stop physical therapy.  Continue home exercises.  Spent at least 30 minutes discussing all the potential diagnosis, treatment options updating all of her paperwork  Follow-Up Instructions: Return in about 1 month (around 09/16/2017).   Orders:  Orders Placed This Encounter  Procedures  . Hand/UE Inj: L elbow   No orders of the defined types were placed in this encounter.     Procedures: Hand/UE Inj: L elbow for medial epicondylitis on 08/17/2017 8:36 AM Medications: 1 mL lidocaine 1 %; 1 mL lidocaine (PF) 1 %; 1 mL bupivacaine 0.5 %; 40 mg methylPREDNISolone acetate 40 MG/ML      Clinical Data: No additional findings.   Subjective: Chief Complaint  Patient presents with  . Left Elbow - Pain  . Follow-up    6 week F/U going to therapy, wake pt from sleeping, trouble reaching to get things done like unfasten bra, put deodorant on, some days worse than others  Pain left elbow is better but still present.  Having some compromise of activities of daily living as mentioned above.  Please a course of physical therapy.  She thinks gabapentin may have helped.  Still not having any numbness or tingling.  Working light  duty  HPI  Review of Systems  Constitutional: Negative for fatigue and fever.  HENT: Negative for ear pain.   Eyes: Negative for pain.  Respiratory: Negative for cough and shortness of breath.   Cardiovascular: Negative for leg swelling.  Gastrointestinal: Negative for constipation and diarrhea.  Genitourinary: Negative for difficulty urinating.  Musculoskeletal: Negative for back pain and neck pain.  Skin: Negative for rash.  Allergic/Immunologic: Negative for food allergies.  Hematological: Does not bruise/bleed easily.  Psychiatric/Behavioral: Positive for sleep disturbance.     Objective: Vital Signs: BP (!) 154/94 (BP Location: Right Arm, Patient Position: Sitting, Cuff Size: Normal)   Pulse 71   Resp 18   Ht 5\' 10"  (1.778 m)   Wt 270 lb (122.5 kg)   BMI 38.74 kg/m   Physical Exam  Constitutional: She is oriented to person, place, and time. She appears well-developed and well-nourished.  HENT:  Head: Normocephalic and atraumatic.  Eyes: Pupils are equal, round, and reactive to light. EOM are normal.  Pulmonary/Chest: Effort normal.  Neurological: She is alert and oriented to person, place, and time.  Skin: Skin is warm and dry.  Psychiatric: She has a normal mood and affect. Her behavior is normal. Judgment and thought content normal.    Ortho Exam awake alert and oriented x3.  Comfortable sitting.  Multiple of areas of tenderness about the left elbow out of proportion to what I would expect.  Seems to have more tenderness along the medial epicondyle in any other place in the elbow.  He has some tenderness laterally.  No numbness or tingling.  No Tinel's.  Full range of motion of the elbow no related neck pain or shoulder discomfort.  Specialty Comments:  No specialty comments available.  Imaging: No results found.   PMFS History: Patient Active Problem List   Diagnosis Date Noted  . Pain in left elbow 08/17/2017  . Pregnancy 09/09/2016  . Group B  streptococcal bacteriuria 04/25/2016  . Urinary urgency 06/29/2015  . Redness of eye, right 06/29/2015  . Health maintenance examination 08/15/2014  . Low HDL (under 40) 08/15/2014  . Obesity, Class II, BMI 35-39.9, with comorbidity 08/15/2014  . Depression with anxiety 10/12/2013  . History of hypertension 01/20/2011  . MIGRAINE HEADACHE 04/28/2008  . Cervicalgia 03/09/2008  . History of HPV infection 05/21/2007   Past Medical History:  Diagnosis Date  . Anxiety   . Depression    on zoloft for years  . HPV (human papilloma virus) infection   . Hypertension   . Migraine    Dr Nelva Bush at [facility] ortho  . Pre-eclampsia     Family History  Problem Relation Age of Onset  . COPD Father        + smoker  . Heart failure Father        CHF  . Hypertension Father   . Stroke Father   . Seizures Father   . Lung cancer Father        + smoker  . Depression Father   . Healthy Mother   . Depression Mother   . Healthy Sister   . Healthy Sister   . Diabetes Paternal Grandfather   . Alcohol abuse Paternal Grandfather   . Depression Maternal Grandmother   . Heart attack Maternal Grandfather   . Alcohol abuse Maternal Grandfather   . Lung cancer Paternal Grandmother        + smoker    Past Surgical History:  Procedure Laterality Date  . ARM WOUND REPAIR / CLOSURE    . CESAREAN SECTION  01/21/2012   Procedure: CESAREAN SECTION;  Surgeon: Margarette Asal, MD;  Location: [facility];  Service: Obstetrics;  Laterality: N/A;  . CESAREAN SECTION WITH BILATERAL TUBAL LIGATION Bilateral 09/09/2016   Procedure: CESAREAN SECTION WITH BILATERAL TUBAL LIGATION;  Surgeon: Molli Posey, MD;  Location: [facility];  Service: Obstetrics;  Laterality: Bilateral;  Repeat edc 09/17/16 nkda Jill Side, RNFA  . MRI  2010   [facility] - showing bulging disc C5/6  . MYOMECTOMY  01/21/2012   Procedure: MYOMECTOMY;  Surgeon: Margarette Asal, MD;  Location: [facility];  Service: Obstetrics;  Laterality: N/A;    Social History   Occupational History  . 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 Smoking status: Never Smoker  . Smokeless tobacco: Never Used  Substance and Sexual Activity  . Alcohol use: Yes    Comment: Occasional  . Drug use: No  . Sexual activity: Yes    Birth control/protection: None

## 2017-08-18 ENCOUNTER — Ambulatory Visit: Payer: BLUE CROSS/BLUE SHIELD | Admitting: Physical Therapy

## 2017-08-18 ENCOUNTER — Telehealth (INDEPENDENT_AMBULATORY_CARE_PROVIDER_SITE_OTHER): Payer: Self-pay | Admitting: Orthopaedic Surgery

## 2017-08-18 ENCOUNTER — Other Ambulatory Visit (INDEPENDENT_AMBULATORY_CARE_PROVIDER_SITE_OTHER): Payer: Self-pay | Admitting: Radiology

## 2017-08-18 ENCOUNTER — Encounter (INDEPENDENT_AMBULATORY_CARE_PROVIDER_SITE_OTHER): Payer: Self-pay | Admitting: Radiology

## 2017-08-18 NOTE — Telephone Encounter (Signed)
Patient called stating she was returning your call with the information you needed to send the letters out.  Amy Costa  Fax [phone]

## 2017-08-18 NOTE — Telephone Encounter (Signed)
Sent letters to [employer] at [phone]

## 2017-08-20 ENCOUNTER — Encounter: Payer: BLUE CROSS/BLUE SHIELD | Admitting: Physical Therapy

## 2017-08-26 ENCOUNTER — Ambulatory Visit: Payer: BLUE CROSS/BLUE SHIELD | Admitting: Podiatry

## 2017-08-26 ENCOUNTER — Other Ambulatory Visit: Payer: Self-pay | Admitting: *Deleted

## 2017-08-26 ENCOUNTER — Encounter: Payer: Self-pay | Admitting: Podiatry

## 2017-08-26 DIAGNOSIS — M722 Plantar fascial fibromatosis: Secondary | ICD-10-CM | POA: Diagnosis not present

## 2017-08-26 MED ORDER — TRIAMCINOLONE ACETONIDE 10 MG/ML IJ SUSP
10.0000 mg | Freq: Once | INTRAMUSCULAR | Status: AC
  Administered 2017-08-26: 10 mg

## 2017-08-26 NOTE — Progress Notes (Signed)
Subjective:   Patient ID: Amy Costa, female   DOB: 37 y.o.   MRN: [id number]   HPI Patient states she developed a flareup over the last couple months and had about 4 months of complete relief   ROS      Objective:  Physical Exam  Neurovascular status intact with inflammation pain plantar aspect right heel at the insertional point of the tendon into the calcaneus     Assessment:  Inflammatory fasciitis right heel plantar surface     Plan:  H&P condition reviewed and careful injection administered right plantar fascia 3 mg Kenalog 5 mg Xylocaine and advised on anti-inflammatories physical therapy supportive shoes and night splint usage.  Reappoint as symptoms indicate

## 2017-09-03 ENCOUNTER — Other Ambulatory Visit: Payer: Self-pay | Admitting: Family Medicine

## 2017-09-03 DIAGNOSIS — D649 Anemia, unspecified: Secondary | ICD-10-CM

## 2017-09-03 DIAGNOSIS — E786 Lipoprotein deficiency: Secondary | ICD-10-CM

## 2017-09-08 ENCOUNTER — Other Ambulatory Visit (INDEPENDENT_AMBULATORY_CARE_PROVIDER_SITE_OTHER): Payer: BLUE CROSS/BLUE SHIELD

## 2017-09-08 DIAGNOSIS — D649 Anemia, unspecified: Secondary | ICD-10-CM

## 2017-09-08 DIAGNOSIS — E786 Lipoprotein deficiency: Secondary | ICD-10-CM

## 2017-09-08 LAB — BASIC METABOLIC PANEL
BUN: 13 mg/dL (ref 6–23)
CO2: 27 mEq/L (ref 19–32)
Calcium: 9.4 mg/dL (ref 8.4–10.5)
Chloride: 106 mEq/L (ref 96–112)
Creatinine, Ser: 0.55 mg/dL (ref 0.40–1.20)
GFR: 130.73 mL/min (ref >60.00)
Glucose, Bld: 82 mg/dL (ref 70–99)
Potassium: 4 mEq/L (ref 3.5–5.1)
Sodium: 143 mEq/L (ref 135–145)

## 2017-09-08 LAB — CBC WITH DIFFERENTIAL/PLATELET
Basophils Absolute: 0.1 10*3/uL (ref 0.0–0.1)
Basophils Relative: 0.8 % (ref 0.0–3.0)
Eosinophils Absolute: 0.3 10*3/uL (ref 0.0–0.7)
Eosinophils Relative: 4.2 % (ref 0.0–5.0)
HCT: 41.6 % (ref 36.0–46.0)
Hemoglobin: 13.7 g/dL (ref 12.0–15.0)
Lymphocytes Relative: 32.2 % (ref 12.0–46.0)
Lymphs Abs: 2.5 10*3/uL (ref 0.7–4.0)
MCHC: 32.9 g/dL (ref 30.0–36.0)
MCV: 77.4 fl — ABNORMAL LOW (ref 78.0–100.0)
Monocytes Absolute: 0.5 10*3/uL (ref 0.1–1.0)
Monocytes Relative: 5.9 % (ref 3.0–12.0)
Neutro Abs: 4.5 10*3/uL (ref 1.4–7.7)
Neutrophils Relative %: 56.9 % (ref 43.0–77.0)
Platelets: 292 10*3/uL (ref 150.0–400.0)
RBC: 5.37 Mil/uL — ABNORMAL HIGH (ref 3.87–5.11)
RDW: 14.7 % (ref 11.5–15.5)
WBC: 7.9 10*3/uL (ref 4.0–10.5)

## 2017-09-08 LAB — LIPID PANEL
Cholesterol: 130 mg/dL (ref 0–200)
HDL: 43.4 mg/dL (ref >39.00)
LDL Cholesterol: 64 mg/dL (ref 0–99)
NonHDL: 86.78
Total CHOL/HDL Ratio: 3
Triglycerides: 115 mg/dL (ref 0.0–149.0)
VLDL: 23 mg/dL (ref 0.0–40.0)

## 2017-09-15 ENCOUNTER — Ambulatory Visit (INDEPENDENT_AMBULATORY_CARE_PROVIDER_SITE_OTHER): Payer: BLUE CROSS/BLUE SHIELD | Admitting: Family Medicine

## 2017-09-15 ENCOUNTER — Encounter: Payer: Self-pay | Admitting: Family Medicine

## 2017-09-15 VITALS — BP 124/82 | HR 75 | Temp 98.3°F | Ht 69.0 in | Wt 268.5 lb

## 2017-09-15 DIAGNOSIS — L219 Seborrheic dermatitis, unspecified: Secondary | ICD-10-CM | POA: Diagnosis not present

## 2017-09-15 DIAGNOSIS — R400 Somnolence: Secondary | ICD-10-CM | POA: Insufficient documentation

## 2017-09-15 DIAGNOSIS — Z0000 Encounter for general adult medical examination without abnormal findings: Secondary | ICD-10-CM | POA: Diagnosis not present

## 2017-09-15 DIAGNOSIS — E786 Lipoprotein deficiency: Secondary | ICD-10-CM

## 2017-09-15 DIAGNOSIS — F411 Generalized anxiety disorder: Secondary | ICD-10-CM | POA: Diagnosis not present

## 2017-09-15 MED ORDER — CITALOPRAM HYDROBROMIDE 10 MG PO TABS: 10.0000 mg | ORAL_TABLET | Freq: Every day | ORAL | 1 refills | Status: DC

## 2017-09-15 MED ORDER — BETAMETHASONE VALERATE 0.12 % EX FOAM: 1.0000 "application " | Freq: Two times a day (BID) | CUTANEOUS | 1 refills | Status: DC

## 2017-09-15 MED ORDER — KETOCONAZOLE 1 % EX SHAM: MEDICATED_SHAMPOO | CUTANEOUS | 1 refills | Status: DC

## 2017-09-15 MED ORDER — ESCITALOPRAM OXALATE 10 MG PO TABS: 10.0000 mg | ORAL_TABLET | Freq: Every day | ORAL | 6 refills | Status: DC

## 2017-09-15 NOTE — Assessment & Plan Note (Signed)
FLP stable. Continue to monitor.

## 2017-09-15 NOTE — Patient Instructions (Addendum)
Schedule dental exam if you're due.  Work on healthy stress relieving strategies.  Start lexapro 39m daily for anxiety.  Consider referral to sleep doctor for evaluation.  Try ketoconazole shampoo and betamethasone foam - price these out - for scalp dermatitis.  Health Maintenance, Female Adopting a healthy lifestyle and getting preventive care can go a long way to promote health and wellness. Talk with your health care provider about what schedule of regular examinations is right for you. This is a good chance for you to check in with your provider about disease prevention and staying healthy. In between checkups, there are plenty of things you can do on your own. Experts have done a lot of research about which lifestyle changes and preventive measures are most likely to keep you healthy. Ask your health care provider for more information. Weight and diet Eat a healthy diet  Be sure to include plenty of vegetables, fruits, low-fat dairy products, and lean protein.  Do not eat a lot of foods high in solid fats, added sugars, or salt.  Get regular exercise. This is one of the most important things you can do for your health. ? Most adults should exercise for at least 150 minutes each week. The exercise should increase your heart rate and make you sweat (moderate-intensity exercise). ? Most adults should also do strengthening exercises at least twice a week. This is in addition to the moderate-intensity exercise.  Maintain a healthy weight  Body mass index (BMI) is a measurement that can be used to identify possible weight problems. It estimates body fat based on height and weight. Your health care provider can help determine your BMI and help you achieve or maintain a healthy weight.  For females 227years of age and older: ? A BMI below 18.5 is considered underweight. ? A BMI of 18.5 to 24.9 is normal. ? A BMI of 25 to 29.9 is considered overweight. ? A BMI of 30 and above is considered  obese.  Watch levels of cholesterol and blood lipids  You should start having your blood tested for lipids and cholesterol at 39years of age, then have this test every 5 years.  You may need to have your cholesterol levels checked more often if: ? Your lipid or cholesterol levels are high. ? You are older than 39years of age. ? You are at high risk for heart disease.  Cancer screening Lung Cancer  Lung cancer screening is recommended for adults 566848years old who are at high risk for lung cancer because of a history of smoking.  A yearly low-dose CT scan of the lungs is recommended for people who: ? Currently smoke. ? Have quit within the past 15 years. ? Have at least a 30-pack-year history of smoking. A pack year is smoking an average of one pack of cigarettes a day for 1 year.  Yearly screening should continue until it has been 15 years since you quit.  Yearly screening should stop if you develop a health problem that would prevent you from having lung cancer treatment.  Breast Cancer  Practice breast self-awareness. This means understanding how your breasts normally appear and feel.  It also means doing regular breast self-exams. Let your health care provider know about any changes, no matter how small.  If you are in your 20s or 30s, you should have a clinical breast exam (CBE) by a health care provider every 1-3 years as part of a regular health exam.  If you  are 40 or older, have a CBE every year. Also consider having a breast X-ray (mammogram) every year.  If you have a family history of breast cancer, talk to your health care provider about genetic screening.  If you are at high risk for breast cancer, talk to your health care provider about having an MRI and a mammogram every year.  Breast cancer gene (BRCA) assessment is recommended for women who have family members with BRCA-related cancers. BRCA-related cancers  include: ? Breast. ? Ovarian. ? Tubal. ? Peritoneal cancers.  Results of the assessment will determine the need for genetic counseling and BRCA1 and BRCA2 testing.  Cervical Cancer Your health care provider may recommend that you be screened regularly for cancer of the pelvic organs (ovaries, uterus, and vagina). This screening involves a pelvic examination, including checking for microscopic changes to the surface of your cervix (Pap test). You may be encouraged to have this screening done every 3 years, beginning at age 21.  For women ages 30-65, health care providers may recommend pelvic exams and Pap testing every 3 years, or they may recommend the Pap and pelvic exam, combined with testing for human papilloma virus (HPV), every 5 years. Some types of HPV increase your risk of cervical cancer. Testing for HPV may also be done on women of any age with unclear Pap test results.  Other health care providers may not recommend any screening for nonpregnant women who are considered low risk for pelvic cancer and who do not have symptoms. Ask your health care provider if a screening pelvic exam is right for you.  If you have had past treatment for cervical cancer or a condition that could lead to cancer, you need Pap tests and screening for cancer for at least 20 years after your treatment. If Pap tests have been discontinued, your risk factors (such as having a new sexual partner) need to be reassessed to determine if screening should resume. Some women have medical problems that increase the chance of getting cervical cancer. In these cases, your health care provider may recommend more frequent screening and Pap tests.  Colorectal Cancer  This type of cancer can be detected and often prevented.  Routine colorectal cancer screening usually begins at 39 years of age and continues through 39 years of age.  Your health care provider may recommend screening at an earlier age if you have risk factors  for colon cancer.  Your health care provider may also recommend using home test kits to check for hidden blood in the stool.  A small camera at the end of a tube can be used to examine your colon directly (sigmoidoscopy or colonoscopy). This is done to check for the earliest forms of colorectal cancer.  Routine screening usually begins at age 50.  Direct examination of the colon should be repeated every 5-10 years through 39 years of age. However, you may need to be screened more often if early forms of precancerous polyps or small growths are found.  Skin Cancer  Check your skin from head to toe regularly.  Tell your health care provider about any new moles or changes in moles, especially if there is a change in a mole's shape or color.  Also tell your health care provider if you have a mole that is larger than the size of a pencil eraser.  Always use sunscreen. Apply sunscreen liberally and repeatedly throughout the day.  Protect yourself by wearing long sleeves, pants, a wide-brimmed hat, and sunglasses whenever   you are outside.  Heart disease, diabetes, and high blood pressure  High blood pressure causes heart disease and increases the risk of stroke. High blood pressure is more likely to develop in: ? People who have blood pressure in the high end of the normal range (130-139/85-89 mm Hg). ? People who are overweight or obese. ? People who are African American.  If you are 17-68 years of age, have your blood pressure checked every 3-5 years. If you are 76 years of age or older, have your blood pressure checked every year. You should have your blood pressure measured twice-once when you are at a hospital or clinic, and once when you are not at a hospital or clinic. Record the average of the two measurements. To check your blood pressure when you are not at a hospital or clinic, you can use: ? An automated blood pressure machine at a pharmacy. ? A home blood pressure monitor.  If  you are between 40 years and 61 years old, ask your health care provider if you should take aspirin to prevent strokes.  Have regular diabetes screenings. This involves taking a blood sample to check your fasting blood sugar level. ? If you are at a normal weight and have a low risk for diabetes, have this test once every three years after 39 years of age. ? If you are overweight and have a high risk for diabetes, consider being tested at a younger age or more often. Preventing infection Hepatitis B  If you have a higher risk for hepatitis B, you should be screened for this virus. You are considered at high risk for hepatitis B if: ? You were born in a country where hepatitis B is common. Ask your health care provider which countries are considered high risk. ? Your parents were born in a high-risk country, and you have not been immunized against hepatitis B (hepatitis B vaccine). ? You have HIV or AIDS. ? You use needles to inject street drugs. ? You live with someone who has hepatitis B. ? You have had sex with someone who has hepatitis B. ? You get hemodialysis treatment. ? You take certain medicines for conditions, including cancer, organ transplantation, and autoimmune conditions.  Hepatitis C  Blood testing is recommended for: ? Everyone born from [birth year] through 1967. ? Anyone with known risk factors for hepatitis C.  Sexually transmitted infections (STIs)  You should be screened for sexually transmitted infections (STIs) including gonorrhea and chlamydia if: ? You are sexually active and are younger than 39 years of age. ? You are older than 38 years of age and your health care provider tells you that you are at risk for this type of infection. ? Your sexual activity has changed since you were last screened and you are at an increased risk for chlamydia or gonorrhea. Ask your health care provider if you are at risk.  If you do not have HIV, but are at risk, it may be recommended  that you take a prescription medicine daily to prevent HIV infection. This is called pre-exposure prophylaxis (PrEP). You are considered at risk if: ? You are sexually active and do not regularly use condoms or know the HIV status of your partner(s). ? You take drugs by injection. ? You are sexually active with a partner who has HIV.  Talk with your health care provider about whether you are at high risk of being infected with HIV. If you choose to begin PrEP, you should first  be tested for HIV. You should then be tested every 3 months for as long as you are taking PrEP. Pregnancy  If you are premenopausal and you may become pregnant, ask your health care provider about preconception counseling.  If you may become pregnant, take 400 to 800 micrograms (mcg) of folic acid every day.  If you want to prevent pregnancy, talk to your health care provider about birth control (contraception). Osteoporosis and menopause  Osteoporosis is a disease in which the bones lose minerals and strength with aging. This can result in serious bone fractures. Your risk for osteoporosis can be identified using a bone density scan.  If you are 65 years of age or older, or if you are at risk for osteoporosis and fractures, ask your health care provider if you should be screened.  Ask your health care provider whether you should take a calcium or vitamin D supplement to lower your risk for osteoporosis.  Menopause may have certain physical symptoms and risks.  Hormone replacement therapy may reduce some of these symptoms and risks. Talk to your health care provider about whether hormone replacement therapy is right for you. Follow these instructions at home:  Schedule regular health, dental, and eye exams.  Stay current with your immunizations.  Do not use any tobacco products including cigarettes, chewing tobacco, or electronic cigarettes.  If you are pregnant, do not drink alcohol.  If you are  breastfeeding, limit how much and how often you drink alcohol.  Limit alcohol intake to no more than 1 drink per day for nonpregnant women. One drink equals 12 ounces of beer, 5 ounces of wine, or 1 ounces of hard liquor.  Do not use street drugs.  Do not share needles.  Ask your health care provider for help if you need support or information about quitting drugs.  Tell your health care provider if you often feel depressed.  Tell your health care provider if you have ever been abused or do not feel safe at home. This information is not intended to replace advice given to you by your health care provider. Make sure you discuss any questions you have with your health care provider. Document Released: 10/28/2010 Document Revised: 09/20/2015 Document Reviewed: 01/16/2015 [employer]vier Interactive Patient Education  2018 [employer]  

## 2017-09-15 NOTE — Assessment & Plan Note (Signed)
Preventative protocols reviewed and updated unless pt declined. Discussed healthy diet and lifestyle.  

## 2017-09-15 NOTE — Progress Notes (Signed)
BP 124/82 (BP Location: Right Arm, Patient Position: Sitting, Cuff Size: Large)   Pulse 75   Temp 98.3 F (36.8 C) (Oral)   Ht 5\' 9"  (1.753 m)   Wt 268 lb 8 oz (121.8 kg)   LMP 09/04/2017   SpO2 97%   BMI 39.65 kg/m    CC: CPE Subjective:    Patient ID: Amy Costa, female    DOB: 11-29-1980, 37 y.o.   MRN: [id number]  HPI: Amy Costa is a 37 y.o. female presenting on 09/15/2017 for Annual Exam   I last saw patient 07/2014.  New baby girl 08/2016. No longer breast feeding.  Chronic anxiety - may be progressively worsening. At times overwhelming. Previously celexa was tolerated well. Some trouble with regular exercise routine due to ongoing L arm and R foot pain. Has had cortisone injections in the past. On light duty at work.   Migraines are doing better.  Non restorative sleep, snoring, witnessed apnea, and daytime somnolence.   Seborrheic dermatitis along scalp - seeing derm but expensive copays. Requests med for this. clobetasole cream not helping.   Preventative: Well woman exam - Dr Claudean Kinds - last visit 09/2016, normal pap smear.  Flu shot - does not receive Tdap 2013, 08/2016 Seat belt use discussed Sunscreen use discussed. No changing moles on skin.  Non smoker Alcohol - rarely Dentist - due Eye doctor - yearly  Lives with fiance and 2yo child, 1 dog Occ: customer service at [employer] Activity: walks some at work Diet: good water, fruits/vegetables daily  Relevant past medical, surgical, family and social history reviewed and updated as indicated. Interim medical history since our last visit reviewed. Allergies and medications reviewed and updated. Outpatient Medications Prior to Visit  Medication Sig Dispense Refill  . ibuprofen (ADVIL,MOTRIN) 800 MG tablet Take 1 tablet (800 mg total) by mouth every 8 (eight) hours as needed for moderate pain. 30 tablet 0  . clobetasol (OLUX) 0.05 % topical foam APPLY 1 APPLICATION TOPICALLY TO THE  SCALP TWICE A DAY  2  . gabapentin (NEURONTIN) 100 MG capsule Take 1 capsule (100 mg total) by mouth 3 (three) times daily. 30 capsule 0  . traMADol (ULTRAM) 50 MG tablet Take 1 tablet (50 mg total) by mouth every 6 (six) hours as needed. 20 tablet 0   No facility-administered medications prior to visit.      Per HPI unless specifically indicated in ROS section below Review of Systems  Constitutional: Negative for activity change, appetite change, chills, fatigue, fever and unexpected weight change.  HENT: Negative for hearing loss.   Eyes: Negative for visual disturbance.  Respiratory: Negative for cough, chest tightness, shortness of breath and wheezing.   Cardiovascular: Negative for chest pain, palpitations and leg swelling.  Gastrointestinal: Negative for abdominal distention, abdominal pain, blood in stool, constipation, diarrhea, nausea and vomiting.  Genitourinary: Negative for difficulty urinating and hematuria.  Musculoskeletal: Negative for arthralgias, myalgias and neck pain.  Skin: Negative for rash.  Neurological: Negative for dizziness, seizures, syncope and headaches.  Hematological: Negative for adenopathy. Does not bruise/bleed easily.  Psychiatric/Behavioral: Negative for dysphoric mood. The patient is nervous/anxious (intermittent).        Objective:    BP 124/82 (BP Location: Right Arm, Patient Position: Sitting, Cuff Size: Large)   Pulse 75   Temp 98.3 F (36.8 C) (Oral)   Ht 5\' 9"  (1.753 m)   Wt 268 lb 8 oz (121.8 kg)   LMP 09/04/2017   SpO2  97%   BMI 39.65 kg/m   Wt Readings from Last 3 Encounters:  09/15/17 268 lb 8 oz (121.8 kg)  08/17/17 270 lb (122.5 kg)  07/13/17 270 lb (122.5 kg)    Physical Exam  Constitutional: She is oriented to person, place, and time. She appears well-developed and well-nourished. No distress.  HENT:  Head: Normocephalic and atraumatic.  Right Ear: Hearing, tympanic membrane, external ear and ear canal normal.  Left  Ear: Hearing, tympanic membrane, external ear and ear canal normal.  Nose: Nose normal.  Mouth/Throat: Uvula is midline, oropharynx is clear and moist and mucous membranes are normal. No oropharyngeal exudate, posterior oropharyngeal edema or posterior oropharyngeal erythema.  Erythematous scaly scalp at base of hairline bilateral occipital region  Eyes: Pupils are equal, round, and reactive to light. Conjunctivae and EOM are normal. No scleral icterus.  Neck: Normal range of motion. Neck supple. No thyromegaly present.  Cardiovascular: Normal rate, regular rhythm, normal heart sounds and intact distal pulses.  No murmur heard. Pulses:      Radial pulses are 2+ on the right side, and 2+ on the left side.  Pulmonary/Chest: Effort normal and breath sounds normal. No respiratory distress. She has no wheezes. She has no rales.  Abdominal: Soft. Bowel sounds are normal. She exhibits no distension and no mass. There is no tenderness. There is no rebound and no guarding.  Musculoskeletal: Normal range of motion. She exhibits no edema.  Lymphadenopathy:    She has no cervical adenopathy.  Neurological: She is alert and oriented to person, place, and time.  CN grossly intact, station and gait intact  Skin: Skin is warm and dry. Rash noted. There is erythema.  Psychiatric: She has a normal mood and affect. Her behavior is normal. Judgment and thought content normal.  Nursing note and vitals reviewed.  Results for orders placed or performed in visit on 09/15/17  HM PAP SMEAR  Result Value Ref Range   HM Pap smear normal per patient       Assessment & Plan:   Problem List Items Addressed This Visit    Daytime somnolence    Multiple symptoms of OSA - declines pulm referral at this time.       GAD (generalized anxiety disorder)    Deteriorated, has been off celexa - will start more potent lexapro 10mg  daily. I asked her to stay in close contact with me on effect of medication. RTC 6 wks if no  improvement after starting SSRI. Pt agrees with plan.  PHQ9 = 8 GAD7 = 19      Relevant Medications   escitalopram (LEXAPRO) 10 MG tablet   Health maintenance examination - Primary    Preventative protocols reviewed and updated unless pt declined. Discussed healthy diet and lifestyle.       Low HDL (under 40)    FLP stable. Continue to monitor.       Seborrheic dermatitis of scalp    Prior saw derm - unaffordable meds and specialist copay. Will Rx ketoconazole shampoo and betamethasone foam to use twice weekly. Update with effect or if unaffordable. She may need to return to derm.       Severe obesity (BMI 35.0-39.9) with comorbidity ([place])    Encouraged healthy diet and lifestyle changes - activity limited by chronic extremity pain          Meds ordered this encounter  Medications  . DISCONTD: citalopram (CELEXA) 10 MG tablet    Sig: Take 1 tablet (10  mg total) by mouth daily.    Dispense:  90 tablet    Refill:  1  . escitalopram (LEXAPRO) 10 MG tablet    Sig: Take 1 tablet (10 mg total) by mouth daily.    Dispense:  30 tablet    Refill:  6    In place of celexa  . Betamethasone Valerate 0.12 % foam    Sig: Apply 1 application topically 2 (two) times daily.    Dispense:  100 g    Refill:  1  . KETOCONAZOLE, TOPICAL, 1 % SHAM    Sig: Use to wash hair twice weekly    Dispense:  200 mL    Refill:  1   Orders Placed This Encounter  Procedures  . HM PAP SMEAR    This external order was created through the Results Console.    Follow up plan: Return in about 1 year (around 09/16/2018), or if symptoms worsen or fail to improve, for annual exam, prior fasting for blood work.  Ria Bush, MD

## 2017-09-15 NOTE — Assessment & Plan Note (Signed)
Multiple symptoms of OSA - declines pulm referral at this time.

## 2017-09-15 NOTE — Assessment & Plan Note (Signed)
Deteriorated, has been off celexa - will start more potent lexapro 10mg  daily. I asked her to stay in close contact with me on effect of medication. RTC 6 wks if no improvement after starting SSRI. Pt agrees with plan.  PHQ9 = 8 GAD7 = 19

## 2017-09-15 NOTE — Assessment & Plan Note (Signed)
Encouraged healthy diet and lifestyle changes - activity limited by chronic extremity pain

## 2017-09-15 NOTE — Assessment & Plan Note (Signed)
Prior saw derm - unaffordable meds and specialist copay. Will Rx ketoconazole shampoo and betamethasone foam to use twice weekly. Update with effect or if unaffordable. She may need to return to derm.

## 2017-09-18 ENCOUNTER — Encounter (INDEPENDENT_AMBULATORY_CARE_PROVIDER_SITE_OTHER): Payer: Self-pay | Admitting: Orthopaedic Surgery

## 2017-09-18 ENCOUNTER — Other Ambulatory Visit (INDEPENDENT_AMBULATORY_CARE_PROVIDER_SITE_OTHER): Payer: Self-pay | Admitting: Radiology

## 2017-09-18 ENCOUNTER — Ambulatory Visit (INDEPENDENT_AMBULATORY_CARE_PROVIDER_SITE_OTHER): Payer: BLUE CROSS/BLUE SHIELD | Admitting: Orthopaedic Surgery

## 2017-09-18 ENCOUNTER — Ambulatory Visit (INDEPENDENT_AMBULATORY_CARE_PROVIDER_SITE_OTHER): Payer: Self-pay

## 2017-09-18 VITALS — BP 158/118 | HR 74 | Ht 70.0 in | Wt 268.0 lb

## 2017-09-18 DIAGNOSIS — G8929 Other chronic pain: Secondary | ICD-10-CM | POA: Diagnosis not present

## 2017-09-18 DIAGNOSIS — M25512 Pain in left shoulder: Secondary | ICD-10-CM | POA: Diagnosis not present

## 2017-09-18 DIAGNOSIS — M25522 Pain in left elbow: Secondary | ICD-10-CM | POA: Diagnosis not present

## 2017-09-18 MED ORDER — BUPIVACAINE HCL 0.5 % IJ SOLN
2.0000 mL | INTRAMUSCULAR | Status: AC | PRN
  Administered 2017-09-18: 2 mL via INTRA_ARTICULAR

## 2017-09-18 MED ORDER — METHYLPREDNISOLONE ACETATE 40 MG/ML IJ SUSP
80.0000 mg | INTRAMUSCULAR | Status: AC | PRN
Start: 2017-09-18 — End: 2017-09-18
  Administered 2017-09-18: 80 mg

## 2017-09-18 MED ORDER — LIDOCAINE HCL 2 % IJ SOLN
2.0000 mL | INTRAMUSCULAR | Status: AC | PRN
  Administered 2017-09-18: 2 mL

## 2017-09-18 NOTE — Progress Notes (Signed)
Office Visit Note   Patient: Amy Costa           Date of Birth: [birth year]           MRN: [id number] Visit Date: 09/18/2017              Requested by: Ria Bush, MD [address] [place], [place] [address] PCP: Ria Bush, MD   Assessment & Plan: Visit Diagnoses:  1. Chronic left shoulder pain   2. Pain in left elbow     Plan: Chronic pain left elbow that has been refractory to present treatment including exercises cortisone injection, gabapentin and over-the-counter medicines.  Mrs. Chokshi has been working light duty activity for the past month.  I am having a difficult time defining the origin of her pain.  She has had an MRI scan revealing some chronic changes along the medial and lateral epicondyles.  Of the left elbow.  No acute changes.  She has not responded to cortisone.  Surgery many years ago which she seemed to think made a difference.  She has had recurrence of her pain for no unknown reason.  There has been no injury or trauma.  She certainly has pain out of proportion to what I normally would  expect as she would hurt everyplace that I would touch.  There were no skin changes about the elbow.  She is having elbow pain referred up to her shoulder.  She does have some discomfort with motion of her shoulder and I wonder if there was some origin of her pain referred from her shoulder.  I have injected the subacromial space.  X-rays were negative.  We will keep her on light duty work for another month and have her return.  Follow-Up Instructions: No follow-ups on file.   Orders:  Orders Placed This Encounter  Procedures  . Large Joint Inj: L subacromial bursa  . XR Shoulder Left   No orders of the defined types were placed in this encounter.     Procedures: Large Joint Inj: L subacromial bursa on 09/18/2017 9:13 AM Indications: pain and diagnostic evaluation Details: 25 G 1.5 in needle, anterolateral approach  Arthrogram: No  Medications:  2 mL lidocaine 2 %; 2 mL bupivacaine 0.5 %; 80 mg methylPREDNISolone acetate 40 MG/ML Consent was given by the patient. Immediately prior to procedure a time out was called to verify the correct patient, procedure, equipment, support staff and site/side marked as required. Patient was prepped and draped in the usual sterile fashion.       Clinical Data: No additional findings.   Subjective: Chief Complaint  Patient presents with  . Left Hand - Pain  . Follow-up    1 MO F/U, still having pain and weakness in left hand/arm, not taking gabapentin because pt states it did not work and was runnning out of  the meds.  Persistent pain about the left elbow despite different treatment options.  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  Acute changes.  She has not responded to local cortisone injection, gabapentin or exercises.  Has been on light duty work for the past month.  Has had some referred pain from her elbow to her shoulder.  Shoulder films were negative motion and she seems to have pain referred to her elbow.  There is been no neck discomfort this or tingling  HPI  Review of Systems  Constitutional: Negative for fatigue and fever.  HENT: Negative for ear pain.   Eyes: Negative for pain.  Respiratory: Negative for cough and shortness of breath.   Cardiovascular: Negative for leg swelling.  Gastrointestinal: Negative for constipation and diarrhea.  Genitourinary: Negative for difficulty urinating.  Musculoskeletal: Negative for back pain and neck pain.  Skin: Negative for rash.  Allergic/Immunologic: Negative for food allergies.  Neurological: Positive for weakness and numbness.  Hematological: Does not bruise/bleed easily.  Psychiatric/Behavioral: Positive for sleep disturbance.     Objective: Vital Signs: BP (!) 158/118 (BP Location: Right Arm, Patient Position: Sitting, Cuff Size: Normal)   Pulse 74   Ht 5\' 10"  (1.778  m)   Wt 268 lb (121.6 kg)   LMP 09/04/2017   BMI 38.45 kg/m   Physical Exam  Constitutional: She is oriented to person, place, and time. She appears well-developed and well-nourished.  HENT:  Mouth/Throat: Oropharynx is clear and moist.  Eyes: Pupils are equal, round, and reactive to light. EOM are normal.  Pulmonary/Chest: Effort normal.  Neurological: She is alert and oriented to person, place, and time.  Skin: Skin is warm and dry.  Psychiatric: She has a normal mood and affect. Her behavior is normal.    Ortho Exam awake alert and oriented x3.  Comfortable sitting.  Cried off and on throughout the office visit related to her pain.  Full range of motion of left elbow but multiple areas of local tenderness to the point where she would actually retract her elbow.  Grip and good release.  No related numbness or tingling.  No Tinel's over any of the major nerves about the elbow.  Does have pain with motion of her left shoulder that again appears to be out of proportion to what I would normally expect.  Skin is intact.  Motion of her shoulder did refer pain to the elbow.  She is having difficulty placing her left arm behind her back and raising it overhead but more pain in the area of her elbow than in her shoulder.  Specialty Comments:  No specialty comments available.  Imaging: Xr Shoulder Left  Result Date: 09/18/2017 Films of the left shoulder obtained in several projections.  Humeral head is centered about the glenoid.  No ectopic calcification.  Normal space between the humeral head and the acromion.  Some degenerative changes at the acromioclavicular joint.  No acute changes    PMFS History: Patient Active Problem List   Diagnosis Date Noted  . Seborrheic dermatitis of scalp 09/15/2017  . Daytime somnolence 09/15/2017  . Pain in left elbow 08/17/2017  . Group B streptococcal bacteriuria 04/25/2016  . Health maintenance examination 08/15/2014  . Low HDL (under 40) 08/15/2014    . Severe obesity (BMI 35.0-39.9) with comorbidity ([place]) 08/15/2014  . GAD (generalized anxiety disorder) 10/12/2013  . History of hypertension 01/20/2011  . MIGRAINE HEADACHE 04/28/2008  . Cervicalgia 03/09/2008  . History of HPV infection 05/21/2007   Past Medical History:  Diagnosis Date  . Anxiety   . Depression    on zoloft for years  . HPV (human papilloma virus) infection   . Hypertension   . Migraine    Dr Nelva Bush at [facility] ortho  . Pre-eclampsia     Family History  Problem Relation Age of Onset  . COPD Father        + smoker  . Heart failure Father        CHF  . Hypertension Father   . Stroke Father   . Seizures Father   .  Lung cancer Father        + smoker  . Depression Father   . Healthy Mother   . Depression Mother   . Healthy Sister   . Healthy Sister   . Diabetes Paternal Grandfather   . Alcohol abuse Paternal Grandfather   . Depression Maternal Grandmother   . Heart attack Maternal Grandfather   . Alcohol abuse Maternal Grandfather   . Lung cancer Paternal Grandmother        + smoker    Past Surgical History:  Procedure Laterality Date  . ARM WOUND REPAIR / CLOSURE    . CESAREAN SECTION  01/21/2012   Procedure: CESAREAN SECTION;  Surgeon: Margarette Asal, MD;  Location: [facility];  Service: Obstetrics;  Laterality: N/A;  . CESAREAN SECTION WITH BILATERAL TUBAL LIGATION Bilateral 09/09/2016   Procedure: CESAREAN SECTION WITH BILATERAL TUBAL LIGATION;  Surgeon: Molli Posey, MD;  Location: [facility];  Service: Obstetrics;  Laterality: Bilateral;  Repeat edc 09/17/16 nkda Jill Side, RNFA  . MRI  2010   [facility] - showing bulging disc C5/6  . MYOMECTOMY  01/21/2012   Procedure: MYOMECTOMY;  Surgeon: Margarette Asal, MD;  Location: [facility];  Service: Obstetrics;  Laterality: N/A;   Social History   Occupational History  . 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 Smoking status: Never  Smoker  . Smokeless tobacco: Never Used  Substance and Sexual Activity  . Alcohol use: Yes    Comment: Occasional  . Drug use: No  . Sexual activity: Yes    Birth control/protection: None

## 2017-09-25 ENCOUNTER — Telehealth (INDEPENDENT_AMBULATORY_CARE_PROVIDER_SITE_OTHER): Payer: Self-pay | Admitting: Orthopaedic Surgery

## 2017-09-25 NOTE — Telephone Encounter (Signed)
Faxed records release form to patient per her request @ [phone]

## 2017-10-14 DIAGNOSIS — M7712 Lateral epicondylitis, left elbow: Secondary | ICD-10-CM | POA: Diagnosis not present

## 2017-10-14 DIAGNOSIS — R52 Pain, unspecified: Secondary | ICD-10-CM | POA: Diagnosis not present

## 2017-10-28 DIAGNOSIS — M7712 Lateral epicondylitis, left elbow: Secondary | ICD-10-CM | POA: Diagnosis not present

## 2017-10-28 DIAGNOSIS — M542 Cervicalgia: Secondary | ICD-10-CM | POA: Diagnosis not present

## 2017-10-28 DIAGNOSIS — Z9889 Other specified postprocedural states: Secondary | ICD-10-CM | POA: Diagnosis not present

## 2017-12-09 DIAGNOSIS — M7712 Lateral epicondylitis, left elbow: Secondary | ICD-10-CM | POA: Diagnosis not present

## 2018-01-13 ENCOUNTER — Encounter: Payer: Self-pay | Admitting: Family Medicine

## 2018-01-13 ENCOUNTER — Ambulatory Visit: Payer: BLUE CROSS/BLUE SHIELD | Admitting: Family Medicine

## 2018-01-13 VITALS — BP 138/80 | HR 65 | Temp 98.2°F | Ht 69.0 in | Wt 269.5 lb

## 2018-01-13 DIAGNOSIS — R8290 Unspecified abnormal findings in urine: Secondary | ICD-10-CM | POA: Diagnosis not present

## 2018-01-13 DIAGNOSIS — N3090 Cystitis, unspecified without hematuria: Secondary | ICD-10-CM | POA: Diagnosis not present

## 2018-01-13 LAB — POC URINALSYSI DIPSTICK (AUTOMATED)
Bilirubin, UA: NEGATIVE
Blood, UA: POSITIVE
Glucose, UA: NEGATIVE
Ketones, UA: NEGATIVE
Nitrite, UA: POSITIVE
Protein, UA: NEGATIVE
Spec Grav, UA: 1.02 (ref 1.010–1.025)
Urobilinogen, UA: 0.2 E.U./dL
pH, UA: 6 (ref 5.0–8.0)

## 2018-01-13 MED ORDER — NITROFURANTOIN MONOHYD MACRO 100 MG PO CAPS: 100.0000 mg | ORAL_CAPSULE | Freq: Two times a day (BID) | ORAL | 0 refills | Status: DC

## 2018-01-13 NOTE — Progress Notes (Signed)
Subjective:    Patient ID: Amy Costa, female    DOB: [birth year], 37 y.o.   MRN: [id number]  HPI This is a 37 yo female who presents today with back pain and urine odor x 2 days. No dysuria, some frequency, no fever/chills, no nausea/vomiting.  No meds for symptoms. Has had uti in past.   Past Medical History:  Diagnosis Date  . Anxiety   . Depression    on zoloft for years  . HPV (human papilloma virus) infection   . Hypertension   . Migraine    Dr Nelva Bush at [facility] ortho  . Pre-eclampsia    Past Surgical History:  Procedure Laterality Date  . ARM WOUND REPAIR / CLOSURE    . CESAREAN SECTION  01/21/2012   Procedure: CESAREAN SECTION;  Surgeon: Margarette Asal, MD;  Location: [facility];  Service: Obstetrics;  Laterality: N/A;  . CESAREAN SECTION WITH BILATERAL TUBAL LIGATION Bilateral 09/09/2016   Procedure: CESAREAN SECTION WITH BILATERAL TUBAL LIGATION;  Surgeon: Molli Posey, MD;  Location: [facility];  Service: Obstetrics;  Laterality: Bilateral;  Repeat edc 09/17/16 nkda Jill Side, RNFA  . MRI  2010   [facility] - showing bulging disc C5/6  . MYOMECTOMY  01/21/2012   Procedure: MYOMECTOMY;  Surgeon: Margarette Asal, MD;  Location: [facility];  Service: Obstetrics;  Laterality: N/A;   Family History  Problem Relation Age of Onset  . COPD Father        + smoker  . Heart failure Father        CHF  . Hypertension Father   . Stroke Father   . Seizures Father   . Lung cancer Father        + smoker  . Depression Father   . Healthy Mother   . Depression Mother   . Healthy Sister   . Healthy Sister   . Diabetes Paternal Grandfather   . Alcohol abuse Paternal Grandfather   . Depression Maternal Grandmother   . Heart attack Maternal Grandfather   . Alcohol abuse Maternal Grandfather   . Lung cancer Paternal Grandmother        + smoker   Social History   Tobacco Use  . Smoking status: Never Smoker  . Smokeless tobacco: Never Used  Substance Use Topics  .  Alcohol use: Yes    Comment: Occasional  . Drug use: No      Review of Systems Per HPI    Objective:   Physical Exam Physical Exam  Constitutional: She is oriented to person, place, and time. She appears well-developed and well-nourished. No distress.  HENT:  Head: Normocephalic and atraumatic.  Cardiovascular: Normal rate, regular rhythm and normal heart sounds.   Pulmonary/Chest: Effort normal and breath sounds normal.  Abdominal: Soft. She exhibits no distension. There is no tenderness. There is no rebound, no guarding and no CVA tenderness.  Neurological: She is alert and oriented to person, place, and time.  Skin: Skin is warm and dry. She is not diaphoretic.  Psychiatric: She has a normal mood and affect. Her behavior is normal. Judgment and thought content normal.  Vitals reviewed.   BP 138/80   Pulse 65   Temp 98.2 F (36.8 C) (Oral)   Ht 5\' 9"  (1.753 m)   Wt 269 lb 8 oz (122.2 kg)   LMP 12/30/2017   Breastfeeding? No   BMI 39.80 kg/m  Wt Readings from Last 3 Encounters:  01/13/18 269 lb 8 oz (122.2  kg)  09/18/17 268 lb (121.6 kg)  09/15/17 268 lb 8 oz (121.8 kg)   Results for orders placed or performed in visit on 01/13/18  POCT Urinalysis Dipstick (Automated)  Result Value Ref Range   Color, UA yellow    Clarity, UA cloudy    Glucose, UA Negative Negative   Bilirubin, UA negative    Ketones, UA negative    Spec Grav, UA 1.020 1.010 - 1.025   Blood, UA positive    pH, UA 6.0 5.0 - 8.0   Protein, UA Negative Negative   Urobilinogen, UA 0.2 0.2 or 1.0 E.U./dL   Nitrite, UA positive    Leukocytes, UA Small (1+) (A) Negative       Assessment & Plan:  1. Bad odor of urine - POCT Urinalysis Dipstick (Automated)  2. Cystitis -Provided written and verbal information regarding diagnosis and treatment. - RTC/ER precautions reviewed - OTC analgesics prn pain - encouraged good fluid intake - nitrofurantoin, macrocrystal-monohydrate, (MACROBID) 100 MG  capsule; Take 1 capsule (100 mg total) by mouth 2 (two) times daily.  Dispense: 14 capsule; Refill: 0 - Urine Culture   Clarene Reamer, FNP-BC  [facility], [facility]  01/13/2018 4:28 PM

## 2018-01-13 NOTE — Patient Instructions (Signed)
Good to see you today   Urinary Tract Infection, Adult A urinary tract infection (UTI) is an infection of any part of the urinary tract. The urinary tract includes the:  Kidneys.  Ureters.  Bladder.  Urethra.  These organs make, store, and get rid of pee (urine) in the body. Follow these instructions at home:  Take over-the-counter and prescription medicines only as told by your doctor.  If you were prescribed an antibiotic medicine, take it as told by your doctor. Do not stop taking the antibiotic even if you start to feel better.  Avoid the following drinks: ? Alcohol. ? Caffeine. ? Tea. ? Carbonated drinks.  Drink enough fluid to keep your pee clear or pale yellow.  Keep all follow-up visits as told by your doctor. This is important.  Make sure to: ? Empty your bladder often and completely. Do not to hold pee for long periods of time. ? Empty your bladder before and after sex. ? Wipe from front to back after a bowel movement if you are female. Use each tissue one time when you wipe. Contact a doctor if:  You have back pain.  You have a fever.  You feel sick to your stomach (nauseous).  You throw up (vomit).  Your symptoms do not get better after 3 days.  Your symptoms go away and then come back. Get help right away if:  You have very bad back pain.  You have very bad lower belly (abdominal) pain.  You are throwing up and cannot keep down any medicines or water. This information is not intended to replace advice given to you by your health care provider. Make sure you discuss any questions you have with your health care provider. Document Released: 10/01/2007 Document Revised: 09/20/2015 Document Reviewed: 03/05/2015 Elsevier Interactive Patient Education  [employer].

## 2018-01-15 LAB — URINE CULTURE
MICRO NUMBER:: 91120415
SPECIMEN QUALITY:: ADEQUATE

## 2018-03-10 ENCOUNTER — Other Ambulatory Visit: Payer: Self-pay | Admitting: Obstetrics and Gynecology

## 2018-03-10 DIAGNOSIS — Z1231 Encounter for screening mammogram for malignant neoplasm of breast: Secondary | ICD-10-CM

## 2018-03-18 ENCOUNTER — Ambulatory Visit
Admission: RE | Admit: 2018-03-18 | Discharge: 2018-03-18 | Disposition: A | Discharge status: Home / Self Care | Payer: BLUE CROSS/BLUE SHIELD | Source: Ambulatory Visit | Attending: Obstetrics and Gynecology | Admitting: Obstetrics and Gynecology

## 2018-03-18 DIAGNOSIS — Z1231 Encounter for screening mammogram for malignant neoplasm of breast: Secondary | ICD-10-CM | POA: Insufficient documentation

## 2018-07-09 ENCOUNTER — Other Ambulatory Visit: Payer: Self-pay | Admitting: Family Medicine

## 2018-11-16 ENCOUNTER — Other Ambulatory Visit: Payer: Self-pay | Admitting: Family Medicine

## 2018-11-16 NOTE — Telephone Encounter (Signed)
Last filled 07/09/18 #30 x 2 rf Last OV 08/2017 CPE, 01/13/2018 acute visit No future appts - DUE FOR ANNUAL VISIT

## 2018-11-17 NOTE — Telephone Encounter (Signed)
Sent in - asked to schedule CPE or med refill visit.

## 2018-11-23 ENCOUNTER — Other Ambulatory Visit: Payer: Self-pay

## 2018-11-23 ENCOUNTER — Encounter: Payer: Self-pay | Admitting: Family Medicine

## 2018-11-23 ENCOUNTER — Telehealth: Payer: Self-pay

## 2018-11-23 ENCOUNTER — Ambulatory Visit: Payer: BC Managed Care – PPO | Admitting: Family Medicine

## 2018-11-23 VITALS — BP 128/84 | HR 71 | Temp 97.9°F | Resp 14 | Ht 69.0 in | Wt 261.0 lb

## 2018-11-23 DIAGNOSIS — M545 Low back pain, unspecified: Secondary | ICD-10-CM

## 2018-11-23 DIAGNOSIS — R350 Frequency of micturition: Secondary | ICD-10-CM

## 2018-11-23 LAB — POCT URINALYSIS DIPSTICK
Bilirubin, UA: NEGATIVE
Blood, UA: NEGATIVE
Glucose, UA: NEGATIVE
Ketones, UA: NEGATIVE
Leukocytes, UA: NEGATIVE
Nitrite, UA: NEGATIVE
Protein, UA: NEGATIVE
Spec Grav, UA: 1.02 (ref 1.010–1.025)
Urobilinogen, UA: 0.2 E.U./dL
pH, UA: 6.5 (ref 5.0–8.0)

## 2018-11-23 NOTE — Telephone Encounter (Signed)
Lower back pain for 2 days, frequency of urine but no burning or pain upon urination. Occasional pelvic pain in the middle. No vaginal discharge or itching.pt is feeling tired also. Pt has no covid symptoms, no travel and no known exposure to + covid. Pt scheduled in office visit with Dr Einar Pheasant today at 2:20.

## 2018-11-23 NOTE — Patient Instructions (Addendum)
#   Back Pain - continue doing stretches - tylenol and ibuprofen as needed  #urinary symptoms - continue to monitor - if worsening or new symptoms (burning, fever, chills, nausea/vomiting)

## 2018-11-23 NOTE — Progress Notes (Signed)
Subjective:     Amy Costa is a 38 y.o. female presenting for Back Pain (x 2 days. Urinary frequency present.)              HPI   #Back pain - x 2 days - associated with urinary frequency - concerned she has a UTI - this is how it presented in the past - will have b/l low back pain and occasional pelvic discomfort - treatment: tylenol and ibuprofen w/o a lot of help - sits down all day at work - no recent heavy lifting - back pain: worse if she goes to stand or repositioning in her chair - this may be a little bit earlier than normal for her - LMP 2 weeks ago, has regular cycles - back pain 4/10  - has been doing back pain stretches   Review of Systems  Constitutional: Negative for chills and fever.  Gastrointestinal: Negative for abdominal pain, constipation, diarrhea, nausea and vomiting.  Genitourinary: Positive for flank pain and frequency. Negative for difficulty urinating, dysuria, hematuria, vaginal bleeding and vaginal discharge.  Musculoskeletal: Positive for back pain.     Social History   Tobacco Use  Smoking Status Never Smoker  Smokeless Tobacco Never Used        Objective:    BP Readings from Last 3 Encounters:  11/23/18 128/84  01/13/18 138/80  09/18/17 (!) 158/118   Wt Readings from Last 3 Encounters:  11/23/18 261 lb (118.4 kg)  01/13/18 269 lb 8 oz (122.2 kg)  09/18/17 268 lb (121.6 kg)    BP 128/84   Pulse 71   Temp 97.9 F (36.6 C)   Resp 14   Ht 5\' 9"  (1.753 m)   Wt 261 lb (118.4 kg)   LMP 11/08/2018   BMI 38.54 kg/m    Physical Exam Constitutional:      General: She is not in acute distress.    Appearance: She is well-developed. She is not diaphoretic.  HENT:     Right Ear: External ear normal.     Left Ear: External ear normal.     Nose: Nose normal.  Eyes:     Conjunctiva/sclera: Conjunctivae normal.  Neck:     Musculoskeletal: Neck supple.  Cardiovascular:     Rate and Rhythm: Normal rate and regular  rhythm.     Heart sounds: No murmur.  Pulmonary:     Effort: Pulmonary effort is normal. No respiratory distress.     Breath sounds: Normal breath sounds. No wheezing.  Abdominal:     General: Abdomen is flat. Bowel sounds are normal. There is no distension.     Palpations: Abdomen is soft.     Tenderness: There is no abdominal tenderness. There is no right CVA tenderness or left CVA tenderness.  Musculoskeletal:     Comments: Back: Normal ROM w/o pain TTP along the lumbar spine and bilateral Paraspinous muscles   Skin:    General: Skin is warm and dry.     Capillary Refill: Capillary refill takes less than 2 seconds.  Neurological:     Mental Status: She is alert. Mental status is at baseline.  Psychiatric:        Mood and Affect: Mood normal.        Behavior: Behavior normal.      UA: neg nitrites, neg LE     Assessment & Plan:   Problem List Items Addressed This Visit    None    Visit Diagnoses  Urinary frequency    -  Primary   Relevant Orders   POCT urinalysis dipstick (Completed)   Acute bilateral low back pain without sciatica         Presentation similar to previous UTI with back pain and no dysuria. Discussed that UA showed no signs of infection.   Advised continued monitoring and low back stretching. If continued symptoms or new worsening recommend calling in to discuss repeat urine and if signs of infection would consider culture/Abx at that time.     Return if symptoms worsen or fail to improve.  Lesleigh Noe, MD

## 2018-11-23 NOTE — Telephone Encounter (Signed)
See office note

## 2018-12-13 ENCOUNTER — Ambulatory Visit (INDEPENDENT_AMBULATORY_CARE_PROVIDER_SITE_OTHER): Payer: BC Managed Care – PPO | Admitting: Family Medicine

## 2018-12-13 ENCOUNTER — Encounter: Payer: Self-pay | Admitting: Family Medicine

## 2018-12-13 VITALS — Ht 69.0 in | Wt 260.0 lb

## 2018-12-13 DIAGNOSIS — J069 Acute upper respiratory infection, unspecified: Secondary | ICD-10-CM

## 2018-12-13 NOTE — Progress Notes (Signed)
Virtual Visit via Video Note  I connected with Richardean Chimera on 12/13/18 at 11:45 AM EDT by a video enabled telemedicine application and verified that I am speaking with the correct person using two identifiers.  Location: Patient: In her home Provider: [place]   I discussed the limitations of evaluation and management by telemedicine and the availability of in person appointments. The patient expressed understanding and agreed to proceed.  History of Present Illness: Chief Complaint  Patient presents with  . Nasal Congestion    Congestion and headache x 1 day. Pt took Daytime Cold/Flu OTC. Denies cough, N/V or fever.  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   This is a 38 yo female who requests virtual visit to discuss above symptoms.  She began having nasal congestion yesterday evening with frontal headache.  She denies nasal drainage, fever, wheezing, cough, shortness of breath or body aches.  She has no known COVID-19 exposures.  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  She is taken 1 dose of daytime cold and flu medication this morning with improvement of headache.  Past Medical History:  Diagnosis Date  . Anxiety   . Depression    on zoloft for years  . HPV (human papilloma virus) infection   . Hypertension   . Migraine    Dr Nelva Bush at [facility] ortho  . Pre-eclampsia    Past Surgical History:  Procedure Laterality Date  . ARM WOUND REPAIR / CLOSURE    . CESAREAN SECTION  01/21/2012   Procedure: CESAREAN SECTION;  Surgeon: Margarette Asal, MD;  Location: [facility];  Service: Obstetrics;  Laterality: N/A;  . CESAREAN SECTION WITH BILATERAL TUBAL LIGATION Bilateral 09/09/2016   Procedure: CESAREAN SECTION WITH BILATERAL TUBAL LIGATION;  Surgeon: Molli Posey, MD;  Location: [facility];  Service: Obstetrics;  Laterality: Bilateral;  Repeat edc 09/17/16 nkda Jill Side, RNFA  . MRI  2010   [facility] - showing bulging disc  C5/6  . MYOMECTOMY  01/21/2012   Procedure: MYOMECTOMY;  Surgeon: Margarette Asal, MD;  Location: [facility];  Service: Obstetrics;  Laterality: N/A;   Family History  Problem Relation Age of Onset  . COPD Father        + smoker  . Heart failure Father        CHF  . Hypertension Father   . Stroke Father   . Seizures Father   . Lung cancer Father        + smoker  . Depression Father   . Healthy Mother   . Depression Mother   . Healthy Sister   . Healthy Sister   . Diabetes Paternal Grandfather   . Alcohol abuse Paternal Grandfather   . Depression Maternal Grandmother   . Heart attack Maternal Grandfather   . Alcohol abuse Maternal Grandfather   . Lung cancer Paternal Grandmother        + smoker  . Breast cancer Neg Hx    Social History   Tobacco Use  . Smoking status: Never Smoker  . Smokeless tobacco: Never Used  Substance Use Topics  . Alcohol use: Yes    Comment: Occasional  . Drug use: No      Observations/Objective: Patient is alert and answers questions appropriately.  Visible skin is unremarkable.  She sounds mildly congested.  She is normally conversive without shortness of breath, audible wheeze or witnessed cough.  Her mood and affect are appropriate.  Ht 5\' 9"  (1.753 m)   Wt 260 lb (117.9 kg)   BMI 38.40 kg/m  Wt Readings from Last 3 Encounters:  12/13/18 260 lb (117.9 kg)  11/23/18 261 lb (118.4 kg)  01/13/18 269 lb 8 oz (122.2 kg)    Assessment and Plan: 1. Viral URI -Discussed diagnosis with patient and recommended symptomatic treatment including decongestants, over-the-counter analgesics, nasal spray, cough medicine if needed. -Return to clinic precautions were reviewed and all information sent to patient via my chart   Clarene Reamer, FNP-BC  [facility]  12/13/2018 11:15 AM   Follow Up Instructions: Visit recap sent to patient via my chart as well as note to return to work tomorrow.    I discussed the assessment and treatment plan with the patient. The patient was provided an opportunity to ask questions and all were answered. The patient agreed with the plan and demonstrated an understanding of the instructions.   The patient was advised to call back or seek an in-person evaluation if the symptoms worsen or if the condition fails to improve as anticipated.   Elby Beck, FNP

## 2019-02-18 ENCOUNTER — Ambulatory Visit: Payer: BC Managed Care – PPO | Admitting: Family Medicine

## 2019-02-18 ENCOUNTER — Telehealth: Payer: Self-pay

## 2019-02-18 ENCOUNTER — Other Ambulatory Visit: Payer: Self-pay

## 2019-02-18 ENCOUNTER — Encounter: Payer: Self-pay | Admitting: Family Medicine

## 2019-02-18 DIAGNOSIS — H8112 Benign paroxysmal vertigo, left ear: Secondary | ICD-10-CM | POA: Diagnosis not present

## 2019-02-18 NOTE — Assessment & Plan Note (Signed)
Exam and story consistent with BPPV on left. Dix hallpike positive today. S/p modified epley in office with benefit. Sent home with epleys to do at home. Update if not improving with treatment.

## 2019-02-18 NOTE — Telephone Encounter (Signed)
Seen today. 

## 2019-02-18 NOTE — Progress Notes (Signed)
This visit was conducted in person.  BP 120/84 (BP Location: Left Arm, Patient Position: Sitting, Cuff Size: Large)   Pulse 77   Temp 97.8 F (36.6 C) (Temporal)   Ht 5\' 9"  (1.753 m)   Wt 259 lb (117.5 kg)   LMP 01/31/2019   SpO2 94%   BMI 38.25 kg/m   Orthostatic VS for the past 24 hrs (Last 3 readings):  BP- Lying BP- Standing at 0 minutes  02/18/19 1027 - 124/74  02/18/19 1025 130/84 -     CC: dizziness Subjective:    Patient ID: Amy Costa, female    DOB: 1981/03/11, 38 y.o.   MRN: [id number]  HPI: Amy Costa is a 38 y.o. female presenting on 02/18/2019 for Dizziness (C/o dizziness and feeling like the room is spinning. Sxs started 02/16/19. )   2d h/o room spinning dizzy spells associated with blurry vision and slight posterior headache yesterday. Started when walking to bedroom 2 nights ago. Episodes last seconds. Standing up quickly or sudden head movements precipitate spell.   No fevers/chills, nausea/vomiting, tinnitus, hearing changes, presyncope.  No recent URIs. No slurred speech or unilateral numbness/weakness.   Only on lexapro (no missed doses), no longer on atenolol.      Relevant past medical, surgical, family and social history reviewed and updated as indicated. Interim medical history since our last visit reviewed. Allergies and medications reviewed and updated. Outpatient Medications Prior to Visit  Medication Sig Dispense Refill  . escitalopram (LEXAPRO) 10 MG tablet TAKE 1 TABLET BY MOUTH EVERY DAY **IN PLACE OF CELEXA** 30 tablet 3   No facility-administered medications prior to visit.      Per HPI unless specifically indicated in ROS section below Review of Systems Objective:    BP 120/84 (BP Location: Left Arm, Patient Position: Sitting, Cuff Size: Large)   Pulse 77   Temp 97.8 F (36.6 C) (Temporal)   Ht 5\' 9"  (1.753 m)   Wt 259 lb (117.5 kg)   LMP 01/31/2019   SpO2 94%   BMI 38.25 kg/m   Wt Readings from Last 3  Encounters:  02/18/19 259 lb (117.5 kg)  12/13/18 260 lb (117.9 kg)  11/23/18 261 lb (118.4 kg)    Physical Exam Vitals signs and nursing note reviewed.  Constitutional:      General: She is not in acute distress.    Appearance: Normal appearance. She is obese. She is not ill-appearing.  HENT:     Mouth/Throat:     Mouth: Mucous membranes are moist.     Pharynx: Oropharynx is clear. No oropharyngeal exudate.  Eyes:     Extraocular Movements: Extraocular movements intact.     Conjunctiva/sclera: Conjunctivae normal.     Pupils: Pupils are equal, round, and reactive to light.  Cardiovascular:     Rate and Rhythm: Normal rate and regular rhythm.     Pulses: Normal pulses.     Heart sounds: Normal heart sounds. No murmur.  Pulmonary:     Effort: Pulmonary effort is normal. No respiratory distress.     Breath sounds: Normal breath sounds. No wheezing, rhonchi or rales.  Skin:    Capillary Refill: Capillary refill takes less than 2 seconds.  Neurological:     General: No focal deficit present.     Mental Status: She is alert.     Cranial Nerves: Cranial nerves are intact.     Sensory: Sensation is intact.     Motor: Motor function is intact.  Coordination: Coordination is intact. Romberg sign negative. Finger-Nose-Finger Test normal.     Comments:  CN 2-12 intact FTN intact EOMI No pronator drift, neg romberg Dix Hallpike positive on LEFT        Assessment & Plan:   Problem List Items Addressed This Visit    BPV (benign positional vertigo), left    Exam and story consistent with BPPV on left. Dix hallpike positive today. S/p modified epley in office with benefit. Sent home with epleys to do at home. Update if not improving with treatment.           No orders of the defined types were placed in this encounter.  No orders of the defined types were placed in this encounter.   Patient instructions: I think you have benign positional vertigo on the left. Do  maneuvers provided today.  Let us know if not improving with this.   Follow up plan: Return if symptoms worsen or fail to improve.  Ria Bush, MD

## 2019-02-18 NOTE — Telephone Encounter (Signed)
Pt said starting on 02/16/19 pt had dizziness which continued on 02/17/19 with room spinning at times when up and moving around; pt is still dizzy this morning. When standing and dizzy has some blurred vision. Pt had slight h/a at back of head near neck yesterday but resolved. No H/A today. No weakness in extremities, no CP, SOB. Pt has no covid symptoms, no travel and no known exposure to + covid. Pt scheduled appt in office with Dr Darnell Level today at 10:15 and pt will arrive at 10 AM. FYI to Dr Darnell Level.

## 2019-02-18 NOTE — Patient Instructions (Signed)
I think you have benign positional vertigo on the left. Do maneuvers provided today.  Let us know if not improving with this.   Benign Positional Vertigo Vertigo is the feeling that you or your surroundings are moving when they are not. Benign positional vertigo is the most common form of vertigo. This is usually a harmless condition (benign). This condition is positional. This means that symptoms are triggered by certain movements and positions. This condition can be dangerous if it occurs while you are doing something that could cause harm to you or others. This includes activities such as driving or operating machinery. What are the causes? In many cases, the cause of this condition is not known. It may be caused by a disturbance in an area of the inner ear that helps your brain to sense movement and balance. This disturbance can be caused by:  Viral infection (labyrinthitis).  Head injury.  Repetitive motion, such as jumping, dancing, or running. What increases the risk? You are more likely to develop this condition if:  You are a woman.  You are 75 years of age or older. What are the signs or symptoms? Symptoms of this condition usually happen when you move your head or your eyes in different directions. Symptoms may start suddenly, and usually last for less than a minute. They include:  Loss of balance and falling.  Feeling like you are spinning or moving.  Feeling like your surroundings are spinning or moving.  Nausea and vomiting.  Blurred vision.  Dizziness.  Involuntary eye movement (nystagmus). Symptoms can be mild and cause only minor problems, or they can be severe and interfere with daily life. Episodes of benign positional vertigo may return (recur) over time. Symptoms may improve over time. How is this diagnosed? This condition may be diagnosed based on:  Your medical history.  Physical exam of the head, neck, and ears.  Tests, such as: ? MRI. ? CT scan.  ? Eye movement tests. Your health care provider may ask you to change positions quickly while he or she watches you for symptoms of benign positional vertigo, such as nystagmus. Eye movement may be tested with a variety of exams that are designed to evaluate or stimulate vertigo. ? An electroencephalogram (EEG). This records electrical activity in your brain. ? Hearing tests. You may be referred to a health care provider who specializes in ear, nose, and throat (ENT) problems (otolaryngologist) or a provider who specializes in disorders of the nervous system (neurologist). How is this treated?  This condition may be treated in a session in which your health care provider moves your head in specific positions to adjust your inner ear back to normal. Treatment for this condition may take several sessions. Surgery may be needed in severe cases, but this is rare. In some cases, benign positional vertigo may resolve on its own in 2-4 weeks. Follow these instructions at home: Safety  Move slowly. Avoid sudden body or head movements or certain positions, as told by your health care provider.  Avoid driving until your health care provider says it is safe for you to do so.  Avoid operating heavy machinery until your health care provider says it is safe for you to do so.  Avoid doing any tasks that would be dangerous to you or others if vertigo occurs.  If you have trouble walking or keeping your balance, try using a cane for stability. If you feel dizzy or unstable, sit down right away.  Return to your  normal activities as told by your health care provider. Ask your health care provider what activities are safe for you. General instructions  Take over-the-counter and prescription medicines only as told by your health care provider.  Drink enough fluid to keep your urine pale yellow.  Keep all follow-up visits as told by your health care provider. This is important. Contact a health care  provider if:  You have a fever.  Your condition gets worse or you develop new symptoms.  Your family or friends notice any behavioral changes.  You have nausea or vomiting that gets worse.  You have numbness or a "pins and needles" sensation. Get help right away if you:  Have difficulty speaking or moving.  Are always dizzy.  Faint.  Develop severe headaches.  Have weakness in your legs or arms.  Have changes in your hearing or vision.  Develop a stiff neck.  Develop sensitivity to light. Summary  Vertigo is the feeling that you or your surroundings are moving when they are not. Benign positional vertigo is the most common form of vertigo.  The cause of this condition is not known. It may be caused by a disturbance in an area of the inner ear that helps your brain to sense movement and balance.  Symptoms include loss of balance and falling, feeling that you or your surroundings are moving, nausea and vomiting, and blurred vision.  This condition can be diagnosed based on symptoms, physical exam, and other tests, such as MRI, CT scan, eye movement tests, and hearing tests.  Follow safety instructions as told by your health care provider. You will also be told when to contact your health care provider in case of problems. This information is not intended to replace advice given to you by your health care provider. Make sure you discuss any questions you have with your health care provider. Document Released: 01/20/2006 Document Revised: 09/23/2017 Document Reviewed: 09/23/2017 Elsevier Patient Education  2020 [employer].

## 2019-04-23 ENCOUNTER — Other Ambulatory Visit: Payer: Self-pay | Admitting: Family Medicine

## 2019-08-16 ENCOUNTER — Other Ambulatory Visit: Payer: Self-pay | Admitting: Family Medicine

## 2019-08-16 NOTE — Telephone Encounter (Signed)
Plz schedule CPE and labs.  Then return encounter to me for refill.

## 2019-08-17 NOTE — Telephone Encounter (Signed)
Called patient and left voicemail for patient to call back to schedule cpe and labs.

## 2019-09-21 NOTE — Telephone Encounter (Signed)
Patient called and scheduled labs and cpx on 10/04/19.  She's been out of her medication for 2 days.  Can a refill be sent to [facility] until her appointment.

## 2019-09-21 NOTE — Telephone Encounter (Signed)
E-scribed refill.  Notified pt by phn.  She expresses her thanks.

## 2019-09-28 ENCOUNTER — Other Ambulatory Visit (INDEPENDENT_AMBULATORY_CARE_PROVIDER_SITE_OTHER): Payer: BC Managed Care – PPO

## 2019-09-28 ENCOUNTER — Other Ambulatory Visit: Payer: Self-pay

## 2019-09-28 ENCOUNTER — Other Ambulatory Visit: Payer: Self-pay | Admitting: Family Medicine

## 2019-09-28 DIAGNOSIS — E786 Lipoprotein deficiency: Secondary | ICD-10-CM

## 2019-09-28 LAB — TSH: TSH: 1.04 u[IU]/mL (ref 0.35–4.50)

## 2019-09-28 LAB — LIPID PANEL
Cholesterol: 148 mg/dL (ref 0–200)
HDL: 41.6 mg/dL (ref >39.00)
LDL Cholesterol: 90 mg/dL (ref 0–99)
NonHDL: 106.27
Total CHOL/HDL Ratio: 4
Triglycerides: 79 mg/dL (ref 0.0–149.0)
VLDL: 15.8 mg/dL (ref 0.0–40.0)

## 2019-09-28 LAB — COMPREHENSIVE METABOLIC PANEL
ALT: 11 U/L (ref 0–35)
AST: 12 U/L (ref 0–37)
Albumin: 4.2 g/dL (ref 3.5–5.2)
Alkaline Phosphatase: 58 U/L (ref 39–117)
BUN: 11 mg/dL (ref 6–23)
CO2: 29 mEq/L (ref 19–32)
Calcium: 9.1 mg/dL (ref 8.4–10.5)
Chloride: 103 mEq/L (ref 96–112)
Creatinine, Ser: 0.59 mg/dL (ref 0.40–1.20)
GFR: 112.26 mL/min (ref >60.00)
Glucose, Bld: 84 mg/dL (ref 70–99)
Potassium: 4.3 mEq/L (ref 3.5–5.1)
Sodium: 136 mEq/L (ref 135–145)
Total Bilirubin: 0.5 mg/dL (ref 0.2–1.2)
Total Protein: 6.3 g/dL (ref 6.0–8.3)

## 2019-10-03 ENCOUNTER — Other Ambulatory Visit: Payer: Self-pay | Admitting: Family Medicine

## 2019-10-03 DIAGNOSIS — E786 Lipoprotein deficiency: Secondary | ICD-10-CM

## 2019-10-04 ENCOUNTER — Ambulatory Visit (INDEPENDENT_AMBULATORY_CARE_PROVIDER_SITE_OTHER): Payer: BC Managed Care – PPO | Admitting: Family Medicine

## 2019-10-04 ENCOUNTER — Encounter: Payer: Self-pay | Admitting: Family Medicine

## 2019-10-04 ENCOUNTER — Other Ambulatory Visit: Payer: Self-pay

## 2019-10-04 ENCOUNTER — Ambulatory Visit (INDEPENDENT_AMBULATORY_CARE_PROVIDER_SITE_OTHER)
Admission: RE | Admit: 2019-10-04 | Discharge: 2019-10-04 | Disposition: A | Discharge status: Home / Self Care | Payer: BC Managed Care – PPO | Source: Ambulatory Visit | Attending: Family Medicine | Admitting: Family Medicine

## 2019-10-04 VITALS — BP 146/98 | HR 61 | Temp 97.5°F | Ht 69.75 in | Wt 258.1 lb

## 2019-10-04 DIAGNOSIS — R0989 Other specified symptoms and signs involving the circulatory and respiratory systems: Secondary | ICD-10-CM | POA: Diagnosis not present

## 2019-10-04 DIAGNOSIS — Z0000 Encounter for general adult medical examination without abnormal findings: Secondary | ICD-10-CM | POA: Diagnosis not present

## 2019-10-04 DIAGNOSIS — F411 Generalized anxiety disorder: Secondary | ICD-10-CM

## 2019-10-04 DIAGNOSIS — R0689 Other abnormalities of breathing: Secondary | ICD-10-CM | POA: Diagnosis not present

## 2019-10-04 DIAGNOSIS — R918 Other nonspecific abnormal finding of lung field: Secondary | ICD-10-CM | POA: Diagnosis not present

## 2019-10-04 DIAGNOSIS — I10 Essential (primary) hypertension: Secondary | ICD-10-CM

## 2019-10-04 DIAGNOSIS — E786 Lipoprotein deficiency: Secondary | ICD-10-CM

## 2019-10-04 MED ORDER — ESCITALOPRAM OXALATE 20 MG PO TABS: 20.0000 mg | ORAL_TABLET | Freq: Every day | ORAL | 3 refills | Status: DC

## 2019-10-04 MED ORDER — AMLODIPINE BESYLATE 5 MG PO TABS: 5.0000 mg | ORAL_TABLET | Freq: Every day | ORAL | 11 refills | Status: DC

## 2019-10-04 NOTE — Assessment & Plan Note (Addendum)
Noted today - coarse crackles to bilateral apices L>R. fmhx COPD. Second hand smoke exposure growing up. Baseline CXR today.

## 2019-10-04 NOTE — Assessment & Plan Note (Signed)
H/o this, BP again elevated. Will start amlodipine 5mg  daily, monitoring for pedal edema. HTN instructions provided. Advised to buy BP cuff to monitor at home and RTC if consistently >140/90 for further titration. Pt agrees with plan.

## 2019-10-04 NOTE — Progress Notes (Signed)
This visit was conducted in person.  BP (!) 146/98 (BP Location: Right Arm, Patient Position: Sitting, Cuff Size: Large)   Pulse 61   Temp (!) 97.5 F (36.4 C) (Temporal)   Ht 5' 9.75" (1.772 m)   Wt 258 lb 2 oz (117.1 kg)   LMP 09/29/2019   SpO2 97%   BMI 37.30 kg/m   On repeat 150/90.   CC: CPE Subjective:    Patient ID: Amy Costa, female    DOB: December 17, 1980, 39 y.o.   MRN: [id number]  HPI: Amy Costa is a 39 y.o. female presenting on 10/04/2019 for Annual Exam   H/o HTN previously on atenolol. Off for years.  Notes increased anxiety on lexapro 10mg  daily. Interested in higher dose.   Preventative: Well woman exam - with GYN Dr Claudean Kinds - unsure, thinks 09/2016. Due for f/u. S/p BTL LMP - currently on period, very regular  Flu shot - does not receive Tdap 2013, 08/2016  COVID vaccine - declines at this time  Seat belt use discussed  Sunscreen use discussed. No changing moles on skin  Non smoker  Alcohol - rarely  Dentist - scheduled  Eye doctor - yearly   Lives with fiance and 2 children (2013, 2018), 1 dog Occ: customer service at [employer] Activity: walks during [employer]  Diet: good water, fruits/vegetables daily     Relevant past medical, surgical, family and social history reviewed and updated as indicated. Interim medical history since our last visit reviewed. Allergies and medications reviewed and updated. Outpatient Medications Prior to Visit  Medication Sig Dispense Refill  . escitalopram (LEXAPRO) 10 MG tablet TAKE 1 TABLET (10 MG TOTAL) BY MOUTH DAILY. NEED TO SCHEDULE PHYSICAL APPOINTMENT 90 tablet 0   No facility-administered medications prior to visit.     Per HPI unless specifically indicated in ROS section below Review of Systems  Constitutional: Negative for activity change, appetite change, chills, fatigue, fever and unexpected weight change.  HENT: Negative for hearing loss.   Eyes: Negative for visual  disturbance.  Respiratory: Negative for cough, chest tightness, shortness of breath and wheezing.   Cardiovascular: Negative for chest pain, palpitations and leg swelling.  Gastrointestinal: Negative for abdominal distention, abdominal pain, blood in stool, constipation, diarrhea, nausea and vomiting.  Genitourinary: Negative for difficulty urinating and hematuria.  Musculoskeletal: Negative for arthralgias, myalgias and neck pain.  Skin: Negative for rash.  Neurological: Negative for dizziness, seizures, syncope and headaches.  Hematological: Negative for adenopathy. Does not bruise/bleed easily.  Psychiatric/Behavioral: Negative for dysphoric mood. The patient is nervous/anxious.    Objective:  BP (!) 146/98 (BP Location: Right Arm, Patient Position: Sitting, Cuff Size: Large)   Pulse 61   Temp (!) 97.5 F (36.4 C) (Temporal)   Ht 5' 9.75" (1.772 m)   Wt 258 lb 2 oz (117.1 kg)   LMP 09/29/2019   SpO2 97%   BMI 37.30 kg/m   Wt Readings from Last 3 Encounters:  10/04/19 258 lb 2 oz (117.1 kg)  02/18/19 259 lb (117.5 kg)  12/13/18 260 lb (117.9 kg)      Physical Exam Vitals and nursing note reviewed.  Constitutional:      General: She is not in acute distress.    Appearance: Normal appearance. She is well-developed. She is not ill-appearing.  HENT:     Head: Normocephalic and atraumatic.     Right Ear: Hearing, tympanic membrane, ear canal and external ear normal.     Left Ear: Hearing,  tympanic membrane, ear canal and external ear normal.  Eyes:     General: No scleral icterus.    Extraocular Movements: Extraocular movements intact.     Conjunctiva/sclera: Conjunctivae normal.     Pupils: Pupils are equal, round, and reactive to light.  Cardiovascular:     Rate and Rhythm: Normal rate and regular rhythm.     Pulses: Normal pulses.          Radial pulses are 2+ on the right side and 2+ on the left side.     Heart sounds: Normal heart sounds. No murmur.  Pulmonary:      Effort: Pulmonary effort is normal. No respiratory distress.     Breath sounds: Normal breath sounds. No wheezing, rhonchi or rales.     Comments: Coarse crackles L>R apices Abdominal:     General: Abdomen is flat. Bowel sounds are normal. There is no distension.     Palpations: Abdomen is soft. There is no mass.     Tenderness: There is no abdominal tenderness. There is no guarding or rebound.     Hernia: No hernia is present.  Musculoskeletal:        General: Normal range of motion.     Cervical back: Normal range of motion and neck supple.     Right lower leg: No edema.     Left lower leg: No edema.  Lymphadenopathy:     Cervical: No cervical adenopathy.  Skin:    General: Skin is warm and dry.     Findings: No rash.  Neurological:     General: No focal deficit present.     Mental Status: She is alert and oriented to person, place, and time.     Comments: CN grossly intact, station and gait intact  Psychiatric:        Mood and Affect: Mood normal.        Behavior: Behavior normal.        Thought Content: Thought content normal.        Judgment: Judgment normal.       Results for orders placed or performed in visit on 09/28/19  TSH  Result Value Ref Range   TSH 1.04 0.35 - 4.50 uIU/mL  Comprehensive metabolic panel  Result Value Ref Range   Sodium 136 135 - 145 mEq/L   Potassium 4.3 3.5 - 5.1 mEq/L   Chloride 103 96 - 112 mEq/L   CO2 29 19 - 32 mEq/L   Glucose, Bld 84 70 - 99 mg/dL   BUN 11 6 - 23 mg/dL   Creatinine, Ser 0.59 0.40 - 1.20 mg/dL   Total Bilirubin 0.5 0.2 - 1.2 mg/dL   Alkaline Phosphatase 58 39 - 117 U/L   AST 12 0 - 37 U/L   ALT 11 0 - 35 U/L   Total Protein 6.3 6.0 - 8.3 g/dL   Albumin 4.2 3.5 - 5.2 g/dL   GFR 112.26 >60.00 mL/min   Calcium 9.1 8.4 - 10.5 mg/dL  Lipid panel  Result Value Ref Range   Cholesterol 148 0 - 200 mg/dL   Triglycerides 79.0 0.0 - 149.0 mg/dL   HDL 41.60 >39.00 mg/dL   VLDL 15.8 0.0 - 40.0 mg/dL   LDL Cholesterol 90  0 - 99 mg/dL   Total CHOL/HDL Ratio 4    NonHDL 106.27    Depression screen [facility] 2/9 10/04/2019 09/15/2017 09/15/2017 10/12/2013  Decreased Interest 0 1 0 1  Down, Depressed, Hopeless 0 1 0 2  PHQ - 2 Score 0 2 0 3  Altered sleeping 0 0 - 0  Tired, decreased energy 1 3 - 2  Change in appetite 0 1 - 0  Feeling bad or failure about yourself  0 1 - 0  Trouble concentrating 1 1 - 2  Moving slowly or fidgety/restless 0 0 - 0  Suicidal thoughts 0 0 - 0  PHQ-9 Score 2 8 - 7    GAD 7 : Generalized Anxiety Score 10/04/2019 09/15/2017  Nervous, Anxious, on Edge 2 2  Control/stop worrying 1 3  Worry too much - different things 2 3  Trouble relaxing 1 3  Restless 1 2  Easily annoyed or irritable 2 3  Afraid - awful might happen 1 3  Total GAD 7 Score 10 19   Assessment & Plan:  This visit occurred during the SARS-CoV-2 public health emergency.  Safety protocols were in place, including screening questions prior to the visit, additional usage of staff PPE, and extensive cleaning of exam room while observing appropriate contact time as indicated for disinfecting solutions.   Problem List Items Addressed This Visit    Severe obesity (BMI 35.0-39.9) with comorbidity ([place])    Continue to encourage healthy diet and lifestyle changes to affect sustainable weight loss.       Low HDL (under 40)    Lipids well controlled. Reviewed diet choices to maintain good LDL control.  The 10-year ASCVD risk score Mikey Bussing DC Brooke Bonito., et al., 2013) is: 1%   Values used to calculate the score:     Age: 67 years     Sex: Female     Is Non-Hispanic African American: No     Diabetic: No     Tobacco smoker: No     Systolic Blood Pressure: 017 mmHg     Is BP treated: Yes     HDL Cholesterol: 41.6 mg/dL     Total Cholesterol: 148 mg/dL       Hypertension    H/o this, BP again elevated. Will start amlodipine 5mg  daily, monitoring for pedal edema. HTN instructions provided. Advised to buy BP cuff to monitor at home and RTC  if consistently >140/90 for further titration. Pt agrees with plan.       Relevant Medications   amLODipine (NORVASC) 5 MG tablet   Health maintenance examination - Primary    Preventative protocols reviewed and updated unless pt declined. Discussed healthy diet and lifestyle.       GAD (generalized anxiety disorder)    Chronic, stable but feels anxiety with mood swings remains uncontrolled. Interested in trial 20mg  lexapro - sent to pharmacy.       Relevant Medications   escitalopram (LEXAPRO) 20 MG tablet   Abnormal breath sounds    Noted today - coarse crackles to bilateral apices L>R. fmhx COPD. Second hand smoke exposure growing up. Baseline CXR today.       Relevant Orders   DG Chest 2 View       Meds ordered this encounter  Medications  . escitalopram (LEXAPRO) 20 MG tablet    Sig: Take 1 tablet (20 mg total) by mouth daily.    Dispense:  90 tablet    Refill:  3  . amLODipine (NORVASC) 5 MG tablet    Sig: Take 1 tablet (5 mg total) by mouth daily.    Dispense:  30 tablet    Refill:  11   Orders Placed This Encounter  Procedures  . DG Chest 2  View    Standing Status:   Future    Number of Occurrences:   1    Standing Expiration Date:   10/03/2020    Order Specific Question:   Reason for Exam (SYMPTOM  OR DIAGNOSIS REQUIRED)    Answer:   abnormal breath sounds - crackles L>R apexes    Order Specific Question:   Is patient pregnant?    Answer:   No    Order Specific Question:   Preferred imaging location?    Answer:   [facility]    Order Specific Question:   Radiology Contrast Protocol - do NOT remove file path    Answer:   [email or web address]    Patient instructions: Call to schedule well woman exam.  Labs looked good today. Work on low cholesterol diet to keep good cholesterol control.  Chest xray today Increase lexapro to 20mg  daily, let us know how you tolerate this.  Return as needed or in 1 year for  next physical.  Start amlodipine 5mg  daily. Buy BP cuff to keep track at home. Your goal blood pressure is <140/90.  Work on low salt/sodium diet - goal <1.5gm (1,500mg ) per day. Eat a diet high in fruits/vegetables and whole grains.  Look into mediterranean and DASH diet. Goal activity is 185min/wk of moderate intensity exercise.  This can be split into 30 minute chunks.  If you are not at this level, you can start with smaller 10-15 min increments and slowly build up activity. Look at [place].org for more resources   Follow up plan: Return in about 1 year (around 10/03/2020) for annual exam, prior fasting for blood work.  Ria Bush, MD

## 2019-10-04 NOTE — Assessment & Plan Note (Signed)
Continue to encourage healthy diet and lifestyle changes to affect sustainable weight loss.  

## 2019-10-04 NOTE — Patient Instructions (Addendum)
Call to schedule well woman exam.  Labs looked good today. Work on low cholesterol diet to keep good cholesterol control.  Chest xray today Increase lexapro to 20mg  daily, let us know how you tolerate this.  Return as needed or in 1 year for next physical.  Start amlodipine 5mg  daily. Buy BP cuff to keep track at home. Your goal blood pressure is <140/90.  Work on low salt/sodium diet - goal <1.5gm (1,500mg ) per day. Eat a diet high in fruits/vegetables and whole grains.  Look into mediterranean and DASH diet. Goal activity is 15min/wk of moderate intensity exercise.  This can be split into 30 minute chunks.  If you are not at this level, you can start with smaller 10-15 min increments and slowly build up activity. Look at [place].org for more resources   Health Maintenance, Female Adopting a healthy lifestyle and getting preventive care are important in promoting health and wellness. Ask your health care provider about:  The right schedule for you to have regular tests and exams.  Things you can do on your own to prevent diseases and keep yourself healthy. What should I know about diet, weight, and exercise? Eat a healthy diet   Eat a diet that includes plenty of vegetables, fruits, low-fat dairy products, and lean protein.  Do not eat a lot of foods that are high in solid fats, added sugars, or sodium. Maintain a healthy weight Body mass index (BMI) is used to identify weight problems. It estimates body fat based on height and weight. Your health care provider can help determine your BMI and help you achieve or maintain a healthy weight. Get regular exercise Get regular exercise. This is one of the most important things you can do for your health. Most adults should:  Exercise for at least 150 minutes each week. The exercise should increase your heart rate and make you sweat (moderate-intensity exercise).  Do strengthening exercises at least twice a week. This is in addition to  the moderate-intensity exercise.  Spend less time sitting. Even light physical activity can be beneficial. Watch cholesterol and blood lipids Have your blood tested for lipids and cholesterol at 41 years of age, then have this test every 5 years. Have your cholesterol levels checked more often if:  Your lipid or cholesterol levels are high.  You are older than 41 years of age.  You are at high risk for heart disease. What should I know about cancer screening? Depending on your health history and family history, you may need to have cancer screening at various ages. This may include screening for:  Breast cancer.  Cervical cancer.  Colorectal cancer.  Skin cancer.  Lung cancer. What should I know about heart disease, diabetes, and high blood pressure? Blood pressure and heart disease  High blood pressure causes heart disease and increases the risk of stroke. This is more likely to develop in people who have high blood pressure readings, are of African descent, or are overweight.  Have your blood pressure checked: ? Every 3-5 years if you are 23-52 years of age. ? Every year if you are 48 years old or older. Diabetes Have regular diabetes screenings. This checks your fasting blood sugar level. Have the screening done:  Once every three years after age 44 if you are at a normal weight and have a low risk for diabetes.  More often and at a younger age if you are overweight or have a high risk for diabetes. What should I know  about preventing infection? Hepatitis B If you have a higher risk for hepatitis B, you should be screened for this virus. Talk with your health care provider to find out if you are at risk for hepatitis B infection. Hepatitis C Testing is recommended for:  Everyone born from [birth year] through 1967.  Anyone with known risk factors for hepatitis C. Sexually transmitted infections (STIs)  Get screened for STIs, including gonorrhea and chlamydia, if: ? You  are sexually active and are younger than 41 years of age. ? You are older than 41 years of age and your health care provider tells you that you are at risk for this type of infection. ? Your sexual activity has changed since you were last screened, and you are at increased risk for chlamydia or gonorrhea. Ask your health care provider if you are at risk.  Ask your health care provider about whether you are at high risk for HIV. Your health care provider may recommend a prescription medicine to help prevent HIV infection. If you choose to take medicine to prevent HIV, you should first get tested for HIV. You should then be tested every 3 months for as long as you are taking the medicine. Pregnancy  If you are about to stop having your period (premenopausal) and you may become pregnant, seek counseling before you get pregnant.  Take 400 to 800 micrograms (mcg) of folic acid every day if you become pregnant.  Ask for birth control (contraception) if you want to prevent pregnancy. Osteoporosis and menopause Osteoporosis is a disease in which the bones lose minerals and strength with aging. This can result in bone fractures. If you are 43 years old or older, or if you are at risk for osteoporosis and fractures, ask your health care provider if you should:  Be screened for bone loss.  Take a calcium or vitamin D supplement to lower your risk of fractures.  Be given hormone replacement therapy (HRT) to treat symptoms of menopause. Follow these instructions at home: Lifestyle  Do not use any products that contain nicotine or tobacco, such as cigarettes, e-cigarettes, and chewing tobacco. If you need help quitting, ask your health care provider.  Do not use street drugs.  Do not share needles.  Ask your health care provider for help if you need support or information about quitting drugs. Alcohol use  Do not drink alcohol if: ? Your health care provider tells you not to drink. ? You are  pregnant, may be pregnant, or are planning to become pregnant.  If you drink alcohol: ? Limit how much you use to 0-1 drink a day. ? Limit intake if you are breastfeeding.  Be aware of how much alcohol is in your drink. In the [place] one drink equals one 12 oz bottle of beer (355 mL), one 5 oz glass of wine (148 mL), or one 1 oz glass of hard liquor (44 mL). General instructions  Schedule regular health, dental, and eye exams.  Stay current with your vaccines.  Tell your health care provider if: ? You often feel depressed. ? You have ever been abused or do not feel safe at home. Summary  Adopting a healthy lifestyle and getting preventive care are important in promoting health and wellness.  Follow your health care provider's instructions about healthy diet, exercising, and getting tested or screened for diseases.  Follow your health care provider's instructions on monitoring your cholesterol and blood pressure. This information is not intended to replace advice given to  you by your health care provider. Make sure you discuss any questions you have with your health care provider. Document Revised: 04/07/2018 Document Reviewed: 04/07/2018 Elsevier Patient Education  2020 [employer].

## 2019-10-04 NOTE — Assessment & Plan Note (Signed)
Lipids well controlled. Reviewed diet choices to maintain good LDL control.  The 10-year ASCVD risk score Mikey Bussing DC Brooke Bonito., et al., 2013) is: 1%   Values used to calculate the score:     Age: 39 years     Sex: Female     Is Non-Hispanic African American: No     Diabetic: No     Tobacco smoker: No     Systolic Blood Pressure: 832 mmHg     Is BP treated: Yes     HDL Cholesterol: 41.6 mg/dL     Total Cholesterol: 148 mg/dL

## 2019-10-04 NOTE — Assessment & Plan Note (Signed)
Preventative protocols reviewed and updated unless pt declined. Discussed healthy diet and lifestyle.  

## 2019-10-04 NOTE — Assessment & Plan Note (Signed)
Chronic, stable but feels anxiety with mood swings remains uncontrolled. Interested in trial 20mg  lexapro - sent to pharmacy.

## 2019-11-27 DIAGNOSIS — U071 COVID-19: Secondary | ICD-10-CM

## 2019-11-27 HISTORY — DX: COVID-19: U07.1

## 2019-12-05 IMAGING — MR MR ELBOW*L* W/O CM
4 of 6 series · 23 of 40 positions shown · non-contrast
Comparison: Radiographs 06/09/2017

CLINICAL DATA: Lateral epicondylitis.

EXAM:
MRI OF THE LEFT ELBOW WITHOUT CONTRAST
TECHNIQUE: Multiplanar, multisequence MR imaging of the elbow was performed. No
intravenous contrast was administered.

[Series 12: T2 fat-sat · axial · 4.0mm · 0.35mm/px · z∈[-109,+26]mm · 7 of 31 slices shown (1 of 3)]
[im 1/31]
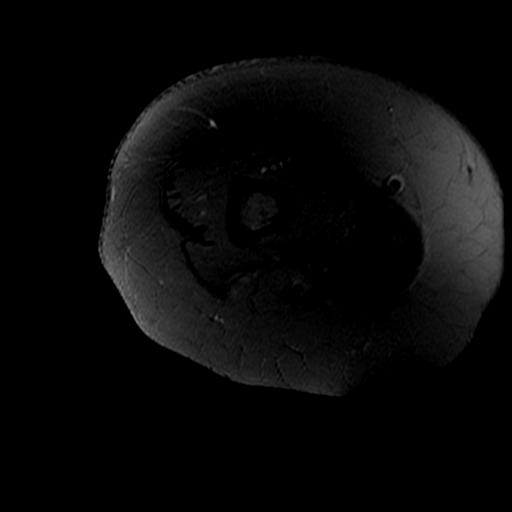
[im 6/31]
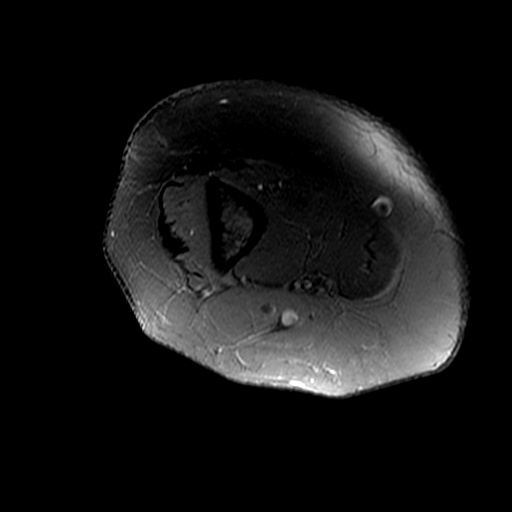
[im 11/31]
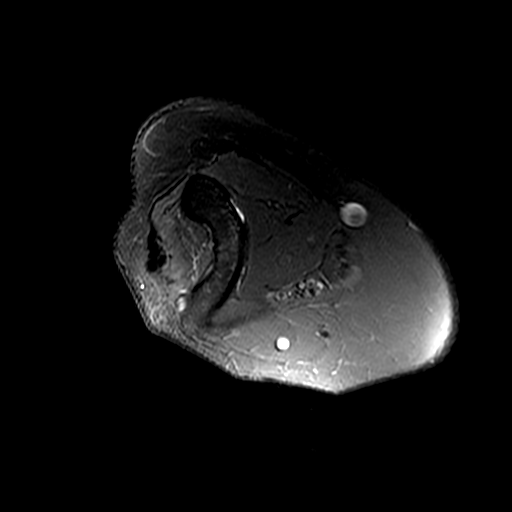
[im 16/31]
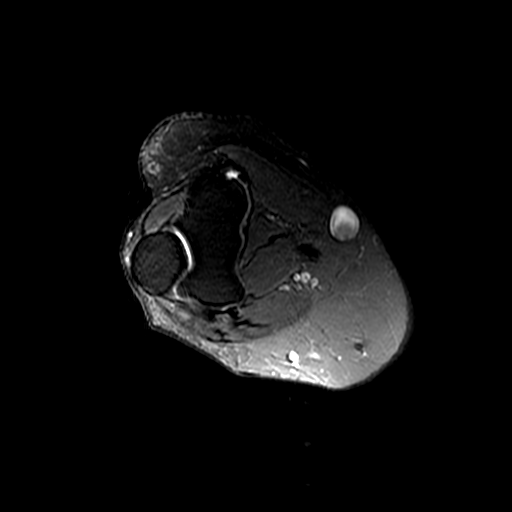
[im 21/31]
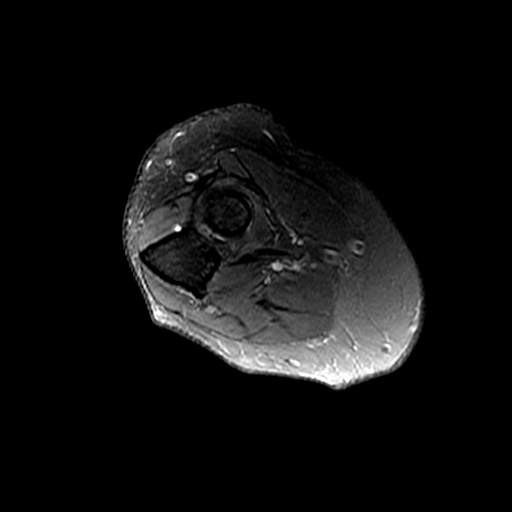
[im 26/31]
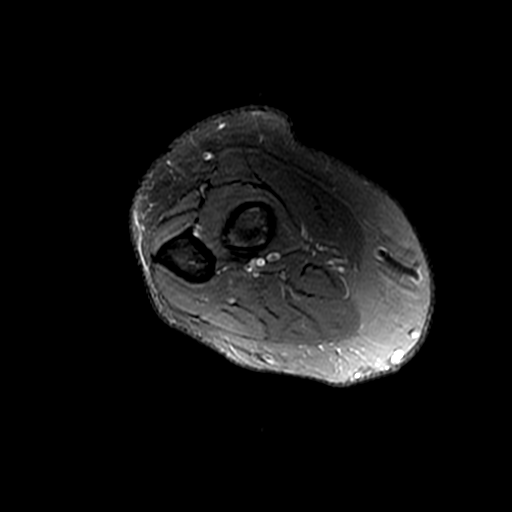
[im 31/31]
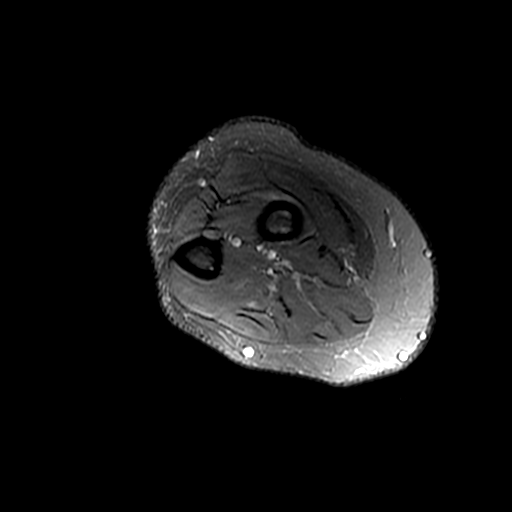

[Series 13: T2 fat-sat · sagittal · 3.0mm · 0.47mm/px · 6 of 28 slices shown (2 of 3)]
[im 1/28]
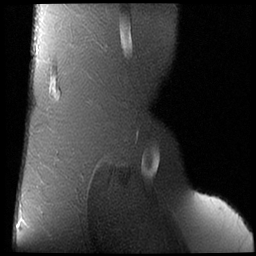
[im 6/28]
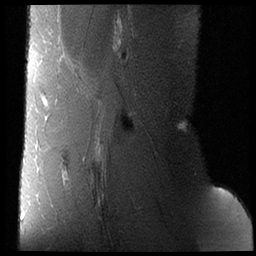
[im 11/28]
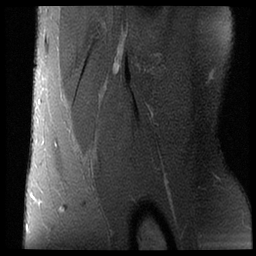
[im 17/28]
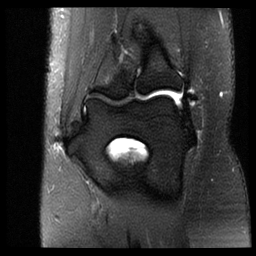
[im 22/28]
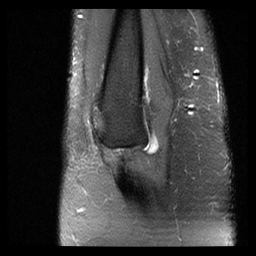
[im 28/28]
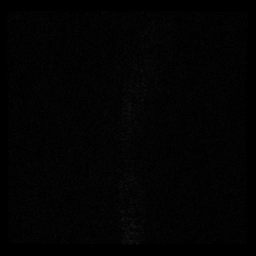

[Series 14: PD fat-sat · sagittal · 3.0mm · 0.23mm/px · 6 of 28 slices shown]
[im 1/28]
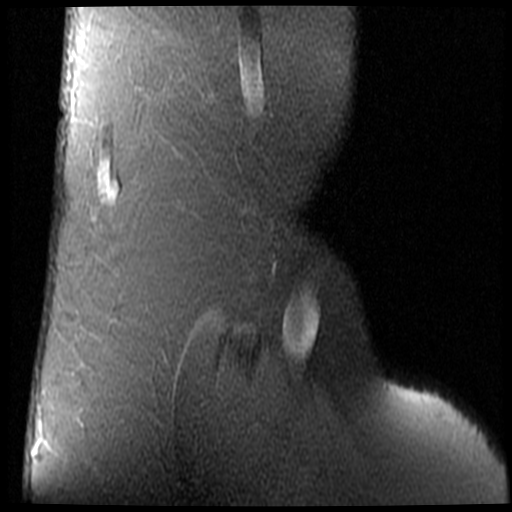
[im 6/28]
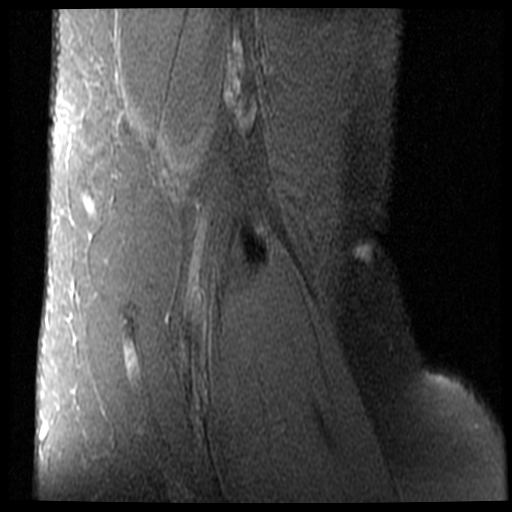
[im 11/28]
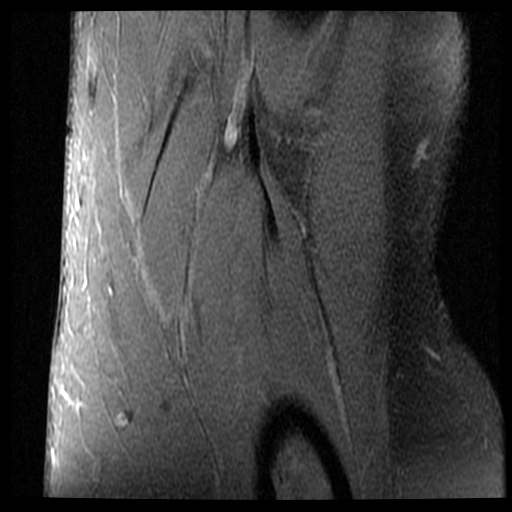
[im 17/28]
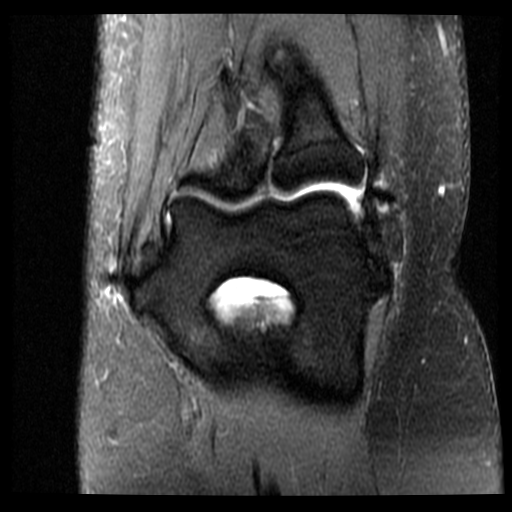
[im 22/28]
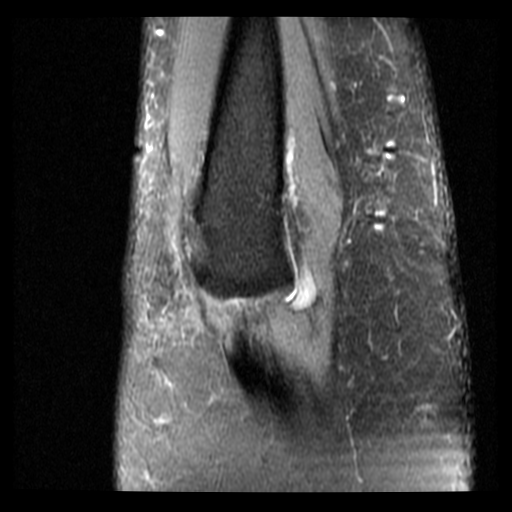
[im 28/28]
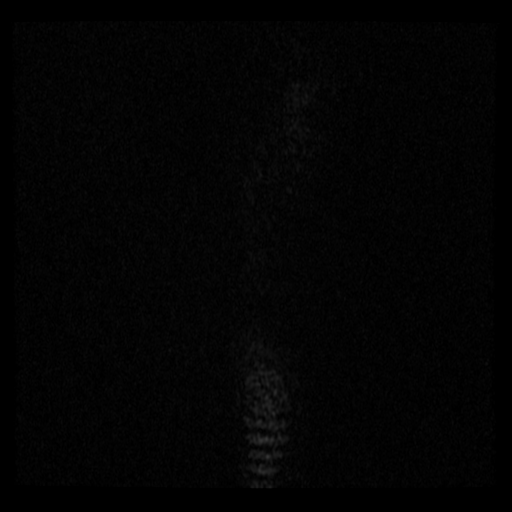

[Series 17: T2 fat-sat · coronal · 3.0mm · 0.27mm/px · 4 of 31 slices shown (3 of 3)]
[im 1/31]
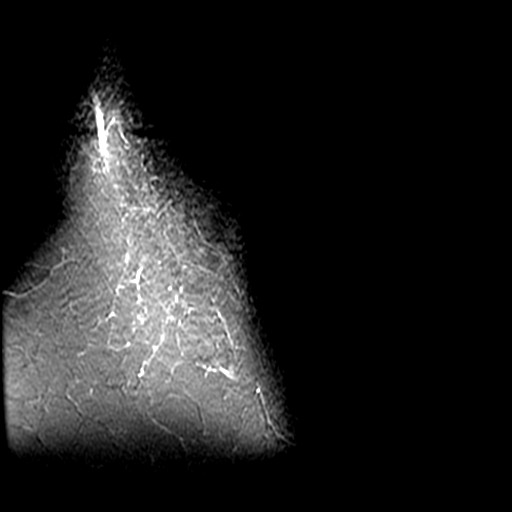
[im 6/31]
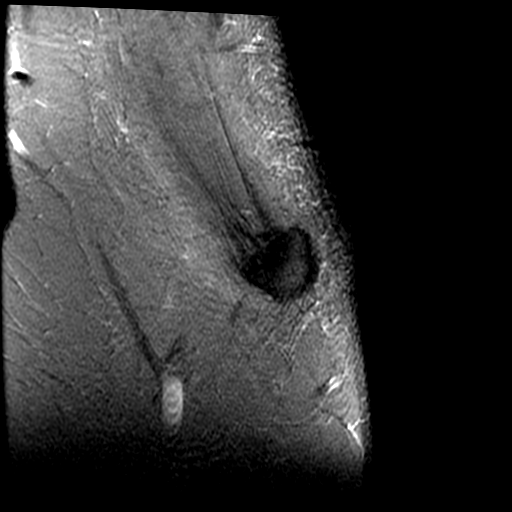
[im 16/31]
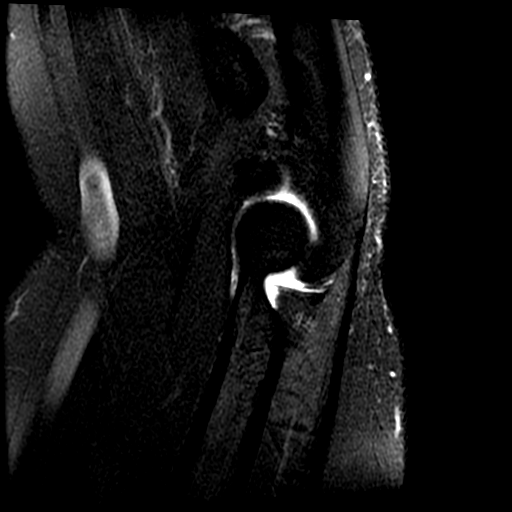
[im 26/31]
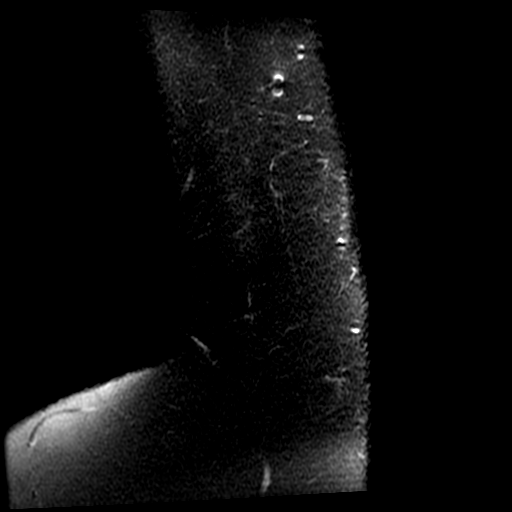

[23 of 40 positions shown; findings below may reference images not displayed]

FINDINGS: TENDONS

Common forearm flexor origin: Changes of chronic calcific tendinitis
versus remote small avulsion fracture. Mild tendinopathy. No
tear/rupture

Common forearm extensor origin: Evidence of prior avulsion injury
versus less likely calcific tendinitis. Tendinopathy and
partial-thickness tear involving the deep fibers.

Biceps: Intact.  The brachialis tendon is also intact.

Triceps: Intact.

LIGAMENTS

Medial stabilizers: Intact

Lateral stabilizers:  Intact

Cartilage: Mild degenerative chondrosis but no cartilage defect or
osteochondral lesion.

Joint: Small joint effusion.  No obvious loose bodies.

Cubital tunnel: Normal

Bones: No acute bony findings.  No osteochondral lesion.

Other: Evidence of prior medial and lateral elbow surgery with tiny
metallic fragments and artifact in the soft tissues.
IMPRESSION: 1. Probable chronic avulsion injuries involving the medial and
lateral epicondyles.
2. Mild common flexor tendinopathy.
3. Moderate common extensor tendinopathy with a partial-thickness
tear involving the deep fibers.
4. Mild elbow joint degenerative changes and small joint effusion.
5. No acute bony findings.
6. Evidence of prior elbow surgery.

## 2019-12-14 ENCOUNTER — Other Ambulatory Visit: Payer: Self-pay | Admitting: Family Medicine

## 2019-12-23 DIAGNOSIS — Z20828 Contact with and (suspected) exposure to other viral communicable diseases: Secondary | ICD-10-CM | POA: Diagnosis not present

## 2019-12-27 ENCOUNTER — Telehealth: Payer: Self-pay | Admitting: *Deleted

## 2019-12-27 ENCOUNTER — Encounter: Payer: Self-pay | Admitting: Family Medicine

## 2019-12-27 NOTE — Telephone Encounter (Signed)
Spoke with pt asking about symptoms.  Says they started on 12/22/19.  C/o HA, cough, loss of taste/smell and fatigue.  Denies fever or SOB.  Notified pt about infusion and the letter.  She verbalizes understanding.  Requests letter be sent via MyChart so she can email it.  FYI to Dr. Orson Ape letter via [place].

## 2019-12-27 NOTE — Telephone Encounter (Signed)
Pt called in requesting a work note. Pt's husband tested positive for covid last week so she took a test Friday and just received her results yesterday that she tested positive for covid also. Pt said her sxs are not severe she just has a bad cough and congestion and she is managing her sxs with OTC meds and ibuprofen. Pt is requesting a work note she works from home and missed yesterday and would like to try and work next week so she is asking if she can be out of work from yesterday until Monday or Tuesday. Pt works from home but due to the cough she is unable to work. Pt not sure if needed a virtual visit or if Dr. Darnell Level could write note, I advise pt I would send message to PCP and his assistant will f/u with her

## 2019-12-27 NOTE — Telephone Encounter (Signed)
When was her first day of symptoms?  Letter for work written and in [employer].  She would likely be eligible for mAb infusion treatment - plz report demographic info to infusion team.

## 2019-12-28 ENCOUNTER — Telehealth: Payer: Self-pay | Admitting: Family

## 2019-12-28 NOTE — Telephone Encounter (Signed)
Called to Discuss with patient about Covid symptoms and the use of the monoclonal antibody infusion for those with mild to moderate Covid symptoms and at a high risk of hospitalization.     Pt appears to qualify for this infusion due to co-morbid conditions and/or a member of an at-risk group in accordance with the FDA Emergency Use Authorization.   Ms. Robert's symptoms started on 8/27. Continues to have cough, congestion, and decreased smell/taste. Risk factors include BMI > 25 and hypertension.  Information about Regeneron discussed and sent via [place]. She would like to think about it and check with insurance.   Terri Piedra, NP 12/28/2019 12:41 PM

## 2019-12-29 ENCOUNTER — Telehealth: Payer: Self-pay

## 2019-12-29 NOTE — Telephone Encounter (Signed)
Lvm asking pt to call back.  Need an update on sxs.  

## 2019-12-30 NOTE — Telephone Encounter (Signed)
Lvm asking pt to call back.  Need an update on sxs.  

## 2020-01-03 NOTE — Telephone Encounter (Signed)
Glad she's doing better.  Ok to stop following.

## 2020-01-03 NOTE — Telephone Encounter (Signed)
Spoke with pt asking for update.  Says she is feeling much better.  Still does not have taste or smell and a little fatigued.  But overall, says she's doing good.

## 2020-01-05 ENCOUNTER — Encounter: Payer: Self-pay | Admitting: Family Medicine

## 2020-03-27 ENCOUNTER — Encounter: Payer: Self-pay | Admitting: Family Medicine

## 2020-03-27 ENCOUNTER — Other Ambulatory Visit: Payer: Self-pay

## 2020-03-27 ENCOUNTER — Telehealth: Payer: Self-pay

## 2020-03-27 ENCOUNTER — Ambulatory Visit: Payer: BC Managed Care – PPO | Admitting: Family Medicine

## 2020-03-27 DIAGNOSIS — S0990XA Unspecified injury of head, initial encounter: Secondary | ICD-10-CM | POA: Diagnosis not present

## 2020-03-27 MED ORDER — ONDANSETRON HCL 4 MG PO TABS: 4.0000 mg | ORAL_TABLET | Freq: Three times a day (TID) | ORAL | 0 refills | Status: DC | PRN

## 2020-03-27 NOTE — Patient Instructions (Addendum)
I think you suffered concussion after hitting head yesterday.  May use zofran as needed for nausea.  May continue tylenol as needed as well.  Use ice to top of head for swelling.  Recommend out of work for the rest of the week, with physical and brain rest for next 2 days then may slowly reincorporate mental activities (reading, puzzles) for 1 day then TV for 1 day. Let us know if symptoms worsen or fail to improve   Concussion, Adult  A concussion is a brain injury from a hard, direct hit (trauma) to the head or body. This direct hit causes the brain to shake quickly back and forth inside the skull. This can damage brain cells and cause chemical changes in the brain. A concussion may also be known as a mild traumatic brain injury (TBI). Concussions are usually not life-threatening, but the effects of a concussion can be serious. If you have a concussion, you should be very careful to avoid having a second concussion. What are the causes? This condition is caused by:  A direct hit to your head, such as: ? Running into another player during a game. ? Being hit in a fight. ? Hitting your head on a hard surface.  Sudden movement of your body that causes your brain to move back and forth inside the skull, such as in a car crash. What are the signs or symptoms? The signs of a concussion can be hard to notice. Early on, they may be missed by you, family members, and health care providers. You may look fine on the outside but may act or feel differently. Symptoms are usually temporary and most often improve in 7-10 days. Some symptoms appear right away, but other symptoms may not show up for hours or days. If your symptoms last longer than normal, you may have post-concussion syndrome. Every head injury is different. Physical symptoms  Headaches. This can include a feeling of pressure in the head or migraine-like symptoms.  Tiredness (fatigue).  Dizziness.  Problems with coordination or  balance.  Vision or hearing problems.  Sensitivity to light or noise.  Nausea or vomiting.  Changes in eating or sleeping patterns.  Numbness or tingling.  Seizure. Mental and emotional symptoms  Memory problems.  Trouble concentrating, organizing, or making decisions.  Slowness in thinking, acting or reacting, speaking, or reading.  Irritability or mood changes.  Anxiety or depression. How is this diagnosed? This condition is diagnosed based on:  Your symptoms.  A description of your injury. You may also have tests, including:  Imaging tests, such as a CT scan or MRI.  Neuropsychological tests. These measure your thinking, understanding, learning, and remembering abilities. How is this treated? Treatment for this condition includes:  Stopping sports or activity if you are injured. If you hit your head or show signs of concussion: ? Do not return to sports or activities the same day. ? Get checked by a health care provider before you return to your activities.  Physical and mental rest and careful observation, usually at home. Gradually return to your normal activities.  Medicines to help with symptoms such as headaches, nausea, or difficulty sleeping. ? Avoid taking opioid pain medicine while recovering from a concussion.  Avoiding alcohol and drugs. These may slow your recovery and can put you at risk of further injury.  Referral to a concussion clinic or rehabilitation center. Recovery from a concussion can take time. How fast you recover depends on many factors. Return to activities  only when:  Your symptoms are completely gone.  Your health care provider says that it is safe. Follow these instructions at home: Activity  Limit activities that require a lot of thought or concentration, such as: ? Doing homework or job-related work. ? Watching TV. ? Working on the computer or phone. ? Playing memory games and puzzles.  Rest. Rest helps your brain  heal. Make sure you: ? Get plenty of sleep. Most adults should get 7-9 hours of sleep each night. ? Rest during the day. Take naps or rest breaks when you feel tired.  Avoid physical activity like exercise until your health care provider says it is safe. Stop any activity that worsens symptoms.  Do not do high-risk activities that could cause a second concussion, such as riding a bike or playing sports.  Ask your health care provider when you can return to your normal activities, such as school, work, athletics, and driving. Your ability to react may be slower after a brain injury. Never do these activities if you are dizzy. Your health care provider will likely give you a plan for gradually returning to activities. General instructions   Take over-the-counter and prescription medicines only as told by your health care provider. Some medicines, such as blood thinners (anticoagulants) and aspirin, may increase the risk for complications, such as bleeding.  Do not drink alcohol until your health care provider says you can.  Watch your symptoms and tell others around you to do the same. Complications sometimes occur after a concussion. Older adults with a brain injury may have a higher risk of serious complications.  Tell your work Freight forwarder, teachers, Government social research officer, school counselor, coach, or Product/process development scientist about your injury, symptoms, and restrictions.  Keep all follow-up visits as told by your health care provider. This is important. How is this prevented? Avoiding another brain injury is very important. In rare cases, another injury can lead to permanent brain damage, brain swelling, or death. The risk of this is greatest during the first 7-10 days after a head injury. Avoid injuries by:  Stopping activities that could lead to a second concussion, such as contact or recreational sports, until your health care provider says it is okay.  Taking these actions once you have returned to sports  or activities: ? Avoiding plays or moves that can cause you to crash into another person. This is how most concussions occur. ? Following the rules and being respectful of other players. Do not engage in violent or illegal plays.  Getting regular exercise that includes strength and balance training.  Wearing a properly fitting helmet during sports, biking, or other activities. Helmets can help protect you from serious skull and brain injuries, but they do not protect you from a concussion. Even when wearing a helmet, you should avoid being hit in the head. Contact a health care provider if:  Your symptoms get worse or they do not improve.  You have new symptoms.  You have another injury. Get help right away if:  You have severe or worsening headaches.  You have weakness or numbness in any part of your body.  You are confused.  Your coordination gets worse.  You vomit repeatedly.  You are sleepier than normal.  Your speech is slurred.  You cannot recognize people or places.  You have a seizure.  It is difficult to wake you up.  You have unusual behavior changes.  You have changes in your vision.  You lose consciousness. Summary  A  concussion is a brain injury that results from a hard, direct hit (trauma) to your head or body.  You may have imaging tests and neuropsychological tests to diagnose a concussion.  Treatment for this condition includes physical and mental rest and careful observation.  Ask your health care provider when you can return to your normal activities, such as school, work, athletics, and driving.  Get help right away if you have a severe headache, weakness on one side of the body, seizures, behavior changes, changes in vision, or if you are confused or sleepier than normal. This information is not intended to replace advice given to you by your health care provider. Make sure you discuss any questions you have with your health care  provider. Document Revised: 12/03/2017 Document Reviewed: 12/03/2017 Elsevier Patient Education  [place].

## 2020-03-27 NOTE — Telephone Encounter (Signed)
[facility] - Client TELEPHONE ADVICE RECORD [facility] Patient Name: Amy Costa Gender: Female DOB: 22-Dec-1980 Age: 39 Y 36 M 14 D Return Phone Number: [phone] (Primary), [phone] (Secondary) Address[place] [address] Client [facility] - Client Client Site [facility] - Day Physician Ria Bush - MD Contact Type Call Who Is Calling Patient / Member / Family / Caregiver Call Type Triage / Clinical Relationship To Patient Self Return Phone Number [phone] (Primary) Chief Complaint Dizziness Reason for Call Symptomatic / Request for [place] states she hit her head, she is dizzy, nauseas and seeing spots Translation No Nurse Assessment Nurse: Jimmye Norman, RN, [place] Date/Time (Eastern Time): 03/27/2020 9:34:12 AM Confirm and document reason for call. If symptomatic, describe symptoms. ---Caller states she hit her head yesterday afternoon, she hit her head on the door of her car, she is dizzy, nausea, and seeing spots. Does the patient have any new or worsening symptoms? ---Yes Will a triage be completed? ---Yes Related visit to physician within the last 2 weeks? ---No Does the PT have any chronic conditions? (i.e. diabetes, asthma, this includes High risk factors for pregnancy, etc.) ---No Is the patient pregnant or possibly pregnant? (Ask all females between the ages of 56-55) ---No Is this a behavioral health or substance abuse call? ---No Guidelines Guideline Title Affirmed Question Affirmed Notes Nurse Date/Time (Eastern Time) Head Injury Sounds like a serious injury to the triager Jimmye Norman, [place], [place] 03/27/2020 9:35:20 AM Disp. Time Eilene Ghazi Time) Disposition Final User 03/27/2020 9:43:40 AM Go to ED Now Yes Jimmye Norman, RN, Loree Fee Caller Disagree/Comply Disagree Caller Understands Yes PreDisposition  InappropriateToAsk PLEASE NOTE: All timestamps contained within this report are represented as [place] Standard Time. CONFIDENTIALTY NOTICE: This fax transmission is intended only for the addressee. It contains information that is legally privileged, confidential or otherwise protected from use or disclosure. If you are not the intended recipient, you are strictly prohibited from reviewing, disclosing, copying using or disseminating any of this information or taking any action in reliance on or regarding this information. If you have received this fax in error, please notify us immediately by telephone so that we can arrange for its return to [place]. Phone: [phone], Toll-Free: [phone], Fax: [phone] Page: 2 of 2 Call Id: [id number] Care Advice Given Per Guideline GO TO ED NOW: Comments User: Myriam Forehand, RN Date/Time Eilene Ghazi Time): 03/27/2020 9:43:16 AM RN felt caller seemed sick, and would benefit from urgent evaluation based on symptoms. States she is dizzy when she walks. Caller states she has an appointment at 12pm with Dr. Danise Mina. States that she does not want to be evaluated in the ED, she wants to wait and see Dr. Darnell Level. Referrals GO TO FACILITY REFUSED

## 2020-03-27 NOTE — Progress Notes (Signed)
Patient ID: Amy Costa, female    DOB: Jul 20, 1980, 39 y.o.   MRN: [id number]  This visit was conducted in person.  BP 124/82 (BP Location: Left Arm, Patient Position: Sitting, Cuff Size: Large)   Pulse 73   Temp 97.8 F (36.6 C) (Temporal)   Ht 5' 9.75" (1.772 m)   Wt 256 lb 9 oz (116.4 kg)   LMP 03/16/2020   SpO2 98%   BMI 37.08 kg/m    CC: head injury  Subjective:   HPI: Amy Costa is a 39 y.o. female presenting on 03/27/2020 for Head Injury (C/o bumping top of head yesterday on car door.  States she sees spots, dizziness and nausea.  Dizziness occurs when going to standing position, sitting up or lying on either side. )   DOI: 03/26/2020  While getting out of car yesterday morning hit top of head on doorframe - very hard hit, saw stars. No bleeding or broken skin.   Felt well initially, worked the rest of day. Did have headache. At end of work day felt dizzy, nauseated, orthostatic lightheadedness (sees floaters when upright), unsteady on feet (noted while cooking dinner), headache worsened with watching TV so she stopped. Poor sleep. Nausea continues, no appetite, hasn't eaten anything today yet. Feels slowed mentation.  Frontal headache persists.  Managing HA with excedrin and tylenol yesterday, nothing today yet.  No vomiting. No new rhinorrhea.  No amnesia or LOC or confusion.   Not on blood thinner.  No h/o head injury in the past.  No h/o concussions in the past.      Relevant past medical, surgical, family and social history reviewed and updated as indicated. Interim medical history since our last visit reviewed. Allergies and medications reviewed and updated. Outpatient Medications Prior to Visit  Medication Sig Dispense Refill  . amLODipine (NORVASC) 5 MG tablet Take 1 tablet (5 mg total) by mouth daily. 30 tablet 11  . escitalopram (LEXAPRO) 20 MG tablet Take 1 tablet (20 mg total) by mouth daily. 90 tablet 3   No facility-administered  medications prior to visit.     Per HPI unless specifically indicated in ROS section below Review of Systems Objective:  BP 124/82 (BP Location: Left Arm, Patient Position: Sitting, Cuff Size: Large)   Pulse 73   Temp 97.8 F (36.6 C) (Temporal)   Ht 5' 9.75" (1.772 m)   Wt 256 lb 9 oz (116.4 kg)   LMP 03/16/2020   SpO2 98%   BMI 37.08 kg/m   Wt Readings from Last 3 Encounters:  03/27/20 256 lb 9 oz (116.4 kg)  10/04/19 258 lb 2 oz (117.1 kg)  02/18/19 259 lb (117.5 kg)      Physical Exam Vitals and nursing note reviewed.  Constitutional:      Appearance: Normal appearance. She is not ill-appearing.  HENT:     Head: Normocephalic and atraumatic. No raccoon eyes or Battle's sign.      Comments: Tender anterior scalp without abrasion, goose egg, stepoff    Right Ear: Tympanic membrane, ear canal and external ear normal.     Left Ear: Tympanic membrane, ear canal and external ear normal.  Eyes:     Extraocular Movements: Extraocular movements intact.     Conjunctiva/sclera: Conjunctivae normal.     Pupils: Pupils are equal, round, and reactive to light.     Comments: No obvious papilledema on limited fundoscopic exam  Cardiovascular:     Rate and Rhythm: Normal rate and  regular rhythm.     Pulses: Normal pulses.     Heart sounds: Normal heart sounds. No murmur heard.   Pulmonary:     Effort: Pulmonary effort is normal. No respiratory distress.     Breath sounds: Normal breath sounds. No wheezing, rhonchi or rales.  Musculoskeletal:     Right lower leg: No edema.     Left lower leg: No edema.  Skin:    General: Skin is warm and dry.     Findings: No rash.  Neurological:     General: No focal deficit present.     Mental Status: She is alert.     GCS: GCS eye subscore is 4. GCS verbal subscore is 5. GCS motor subscore is 6.     Cranial Nerves: Cranial nerves are intact. No cranial nerve deficit.     Sensory: Sensation is intact.     Motor: Motor function is intact.       Coordination: Coordination is intact. Romberg sign negative. Coordination normal. Finger-Nose-Finger Test normal.     Gait: Gait is intact.     Comments:  CN 2-12 intact FTN intact EOMI with mild horizontal nystagmus at bilateral end lateral gaze No pronator drift  Psychiatric:        Mood and Affect: Mood normal.        Behavior: Behavior normal.       Assessment & Plan:  This visit occurred during the SARS-CoV-2 public health emergency.  Safety protocols were in place, including screening questions prior to the visit, additional usage of staff PPE, and extensive cleaning of exam room while observing appropriate contact time as indicated for disinfecting solutions.   Problem List Items Addressed This Visit    Head injury, acute, initial encounter    Head injury yesterday with subsequent nausea, dizziness, persistent frontal headache consistent with concussion after head injury. Rx zofran PRN nausea.  Overall low risk injury - doesn't meet criteria for head imaging.  Will keep out of work for the rest of the week (4 days including today) and discussed brain rest as treatment with gradual return to mental then physical activity. Red flags for head imaging reviewed. Pt agrees with plan.           Meds ordered this encounter  Medications  . ondansetron (ZOFRAN) 4 MG tablet    Sig: Take 1 tablet (4 mg total) by mouth every 8 (eight) hours as needed for nausea or vomiting.    Dispense:  20 tablet    Refill:  0   No orders of the defined types were placed in this encounter.   Patient instructions: I think you suffered concussion after hitting head yesterday.  May use zofran as needed for nausea.  May continue tylenol as needed as well.  Use ice to top of head for swelling.  Recommend out of work for the rest of the week, with physical and brain rest for next 2 days then may slowly reincorporate mental activities (reading, puzzles) for 1 day then TV for 1 day. Let us know if  symptoms worsen or fail to improve   Follow up plan: Return if symptoms worsen or fail to improve.  Ria Bush, MD

## 2020-03-27 NOTE — Telephone Encounter (Signed)
Pt has apt with PCP today.

## 2020-03-27 NOTE — Assessment & Plan Note (Addendum)
Head injury yesterday with subsequent nausea, dizziness, persistent frontal headache consistent with concussion after head injury. Rx zofran PRN nausea.  Overall low risk injury - doesn't meet criteria for head imaging.  Will keep out of work for the rest of the week (4 days including today) and discussed brain rest as treatment with gradual return to mental then physical activity. Red flags for head imaging reviewed. Pt agrees with plan.

## 2020-03-27 NOTE — Telephone Encounter (Signed)
Seen today. 

## 2020-03-28 ENCOUNTER — Encounter: Payer: Self-pay | Admitting: Family Medicine

## 2020-04-04 NOTE — Telephone Encounter (Signed)
Patient came in and dropped off paperwork. Filled and placed in inbox for review and signature.

## 2020-04-05 DIAGNOSIS — Z0279 Encounter for issue of other medical certificate: Secondary | ICD-10-CM

## 2020-04-05 NOTE — Telephone Encounter (Signed)
Will not be in office tomorrow. When paperwork is on desk will you please fax, make 2 copies, and inform patient for me that it is ready?

## 2020-04-05 NOTE — Telephone Encounter (Addendum)
Filled out, will place in my out box when i'm in office tomorrow.

## 2020-04-09 NOTE — Telephone Encounter (Signed)
Informed patient of paperwork being mailed and copy up front for her.   Copy for billing Copy for patient Copy for scan

## 2020-10-21 ENCOUNTER — Other Ambulatory Visit: Payer: Self-pay | Admitting: Family Medicine

## 2020-10-22 NOTE — Telephone Encounter (Signed)
E-scribed refill.  Plz schedule lab and cpe visits.  

## 2020-10-23 NOTE — Telephone Encounter (Signed)
Left voice message to call the office  

## 2021-02-18 ENCOUNTER — Other Ambulatory Visit: Payer: Self-pay | Admitting: Family Medicine

## 2021-05-23 ENCOUNTER — Other Ambulatory Visit: Payer: Self-pay | Admitting: Family Medicine

## 2021-05-23 NOTE — Telephone Encounter (Signed)
Please schedule CPE with fasting labs prior with Dr. Darnell Level.  Please send back to [facility] once appointment is scheduled to refill medication to get her to that appointment.

## 2021-05-23 NOTE — Telephone Encounter (Signed)
LMTCB to schedule cpe and labs

## 2021-05-28 NOTE — Telephone Encounter (Signed)
Noted  

## 2021-05-28 NOTE — Telephone Encounter (Signed)
Called Mrs. Ketter and got her scheduled 5/30 for CPE and 5/23 for labs

## 2021-07-28 ENCOUNTER — Emergency Department (HOSPITAL_BASED_OUTPATIENT_CLINIC_OR_DEPARTMENT_OTHER)
Admission: EM | Admit: 2021-07-28 | Discharge: 2021-07-28 | Disposition: A | Discharge status: Home / Self Care | Payer: BC Managed Care – PPO | Attending: Emergency Medicine | Admitting: Emergency Medicine

## 2021-07-28 ENCOUNTER — Other Ambulatory Visit: Payer: Self-pay

## 2021-07-28 ENCOUNTER — Emergency Department (HOSPITAL_BASED_OUTPATIENT_CLINIC_OR_DEPARTMENT_OTHER): Payer: BC Managed Care – PPO

## 2021-07-28 DIAGNOSIS — Z79899 Other long term (current) drug therapy: Secondary | ICD-10-CM | POA: Insufficient documentation

## 2021-07-28 DIAGNOSIS — K1120 Sialoadenitis, unspecified: Secondary | ICD-10-CM | POA: Insufficient documentation

## 2021-07-28 DIAGNOSIS — I10 Essential (primary) hypertension: Secondary | ICD-10-CM | POA: Insufficient documentation

## 2021-07-28 DIAGNOSIS — R221 Localized swelling, mass and lump, neck: Secondary | ICD-10-CM | POA: Diagnosis not present

## 2021-07-28 DIAGNOSIS — E041 Nontoxic single thyroid nodule: Secondary | ICD-10-CM | POA: Diagnosis not present

## 2021-07-28 LAB — CBC WITH DIFFERENTIAL/PLATELET
Abs Immature Granulocytes: 0.04 10*3/uL (ref 0.00–0.07)
Basophils Absolute: 0.1 10*3/uL (ref 0.0–0.1)
Basophils Relative: 1 %
Eosinophils Absolute: 0.2 10*3/uL (ref 0.0–0.5)
Eosinophils Relative: 2 %
HCT: 40.7 % (ref 36.0–46.0)
Hemoglobin: 12.9 g/dL (ref 12.0–15.0)
Immature Granulocytes: 0 %
Lymphocytes Relative: 18 %
Lymphs Abs: 1.8 10*3/uL (ref 0.7–4.0)
MCH: 24.7 pg — ABNORMAL LOW (ref 26.0–34.0)
MCHC: 31.7 g/dL (ref 30.0–36.0)
MCV: 77.8 fL — ABNORMAL LOW (ref 80.0–100.0)
Monocytes Absolute: 0.8 10*3/uL (ref 0.1–1.0)
Monocytes Relative: 8 %
Neutro Abs: 7.1 10*3/uL (ref 1.7–7.7)
Neutrophils Relative %: 71 %
Platelets: 269 10*3/uL (ref 150–400)
RBC: 5.23 MIL/uL — ABNORMAL HIGH (ref 3.87–5.11)
RDW: 14.5 % (ref 11.5–15.5)
WBC: 10 10*3/uL (ref 4.0–10.5)
nRBC: 0 % (ref 0.0–0.2)

## 2021-07-28 LAB — BASIC METABOLIC PANEL
Anion gap: 8 (ref 5–15)
BUN: 11 mg/dL (ref 6–20)
CO2: 26 mmol/L (ref 22–32)
Calcium: 9.3 mg/dL (ref 8.9–10.3)
Chloride: 103 mmol/L (ref 98–111)
Creatinine, Ser: 0.64 mg/dL (ref 0.44–1.00)
GFR, Estimated: >60 mL/min (ref >60)
Glucose, Bld: 96 mg/dL (ref 70–99)
Potassium: 3.8 mmol/L (ref 3.5–5.1)
Sodium: 137 mmol/L (ref 135–145)

## 2021-07-28 LAB — PREGNANCY, URINE: Preg Test, Ur: NEGATIVE

## 2021-07-28 MED ORDER — AMOXICILLIN-POT CLAVULANATE 875-125 MG PO TABS: 1.0000 | ORAL_TABLET | Freq: Two times a day (BID) | ORAL | 0 refills | Status: AC

## 2021-07-28 MED ORDER — IOHEXOL 300 MG/ML  SOLN
100.0000 mL | Freq: Once | INTRAMUSCULAR | Status: AC | PRN
  Administered 2021-07-28: 75 mL via INTRAVENOUS

## 2021-07-28 MED ORDER — AMOXICILLIN-POT CLAVULANATE 875-125 MG PO TABS
1.0000 | ORAL_TABLET | ORAL | Status: AC
  Administered 2021-07-28: 1 via ORAL
  Filled 2021-07-28: qty 1

## 2021-07-28 NOTE — ED Provider Notes (Signed)
?[facility]ER[facility] DEPT ?Provider Note ? ? ?CSN: [id number] ?Arrival date & time: 07/28/21  1909 ? ?  ? ?History ? ?Chief Complaint  ?Patient presents with  ? Neck Swelling  ? ? ?Amy Costa is a 41 y.o. female with medical history of depression, hypertension, migraines, anxiety.  Patient presents to ED for evaluation of right-sided neck swelling since last night.  Patient reports that she went to bed last night with slight swelling to the right side of her neck and woke up this morning with increased swelling.  Patient states that throughout the day the left side of her neck has began to swell as well.  Patient denies any fevers, nausea, vomiting, recent illness, neck pain, headache, ear pain, sore throat, trouble swallowing, trouble breathing.  Patient denies any ear pain.  Patient denies any dry mouth. ? ?HPI ? ?  ? ?Home Medications ?Prior to Admission medications   ?Medication Sig Start Date End Date Taking? Authorizing Provider  ?amLODipine (NORVASC) 5 MG tablet Take 1 tablet (5 mg total) by mouth daily. 10/04/19   Ria Bush, MD  ?amoxicillin-clavulanate (AUGMENTIN) 875-125 MG tablet Take 1 tablet by mouth 2 (two) times daily for 14 days. 07/28/21 08/11/21 Yes Azucena Cecil, PA-C  ?escitalopram (LEXAPRO) 20 MG tablet TAKE 1 TABLET BY MOUTH EVERY DAY 05/28/21   Ria Bush, MD  ?ondansetron (ZOFRAN) 4 MG tablet Take 1 tablet (4 mg total) by mouth every 8 (eight) hours as needed for nausea or vomiting. 03/27/20   Ria Bush, MD  ?atenolol (TENORMIN) 100 MG tablet Take 1 tablet (100 mg total) by mouth daily. 01/20/11 07/10/11  Ria Bush, MD  ?norethindrone-ethinyl estradiol-iron (LOESTRIN FE 1.5/30) 1.5-30 MG-MCG tablet Take 1 tablet by mouth daily.    07/04/11  [provider]  ?   ? ?Allergies    ?Patient has no known allergies.   ? ?Review of Systems   ?Review of Systems  ?Constitutional:  Negative for chills and fever.  ?HENT:  Negative for dental  problem, drooling, ear pain, sore throat and trouble swallowing.   ?Respiratory:  Negative for shortness of breath.   ?Gastrointestinal:  Negative for nausea and vomiting.  ?Musculoskeletal:  Negative for neck pain.  ?Neurological:  Negative for headaches.  ?Hematological:  Positive for adenopathy.  ?All other systems reviewed and are negative. ? ?Physical Exam ?Updated Vital Signs ?BP 136/75 (BP Location: Left Arm)   Pulse 63   Temp 98.6 ?F (37 ?C)   Resp 16   Ht '5\' 10"'$  (1.778 m)   Wt 120.2 kg   LMP 07/25/2021 (Exact Date)   SpO2 99%   BMI 38.02 kg/m?  ?Physical Exam ?Vitals and nursing note reviewed.  ?Constitutional:   ?   General: She is not in acute distress. ?   Appearance: She is not ill-appearing, toxic-appearing or diaphoretic.  ?HENT:  ?   Head: Normocephalic and atraumatic.  ?   Nose: Nose normal. No congestion.  ?   Mouth/Throat:  ?   Mouth: Mucous membranes are moist.  ?   Pharynx: Oropharynx is clear.  ?Eyes:  ?   Extraocular Movements: Extraocular movements intact.  ?   Conjunctiva/sclera: Conjunctivae normal.  ?   Pupils: Pupils are equal, round, and reactive to light.  ?Cardiovascular:  ?   Rate and Rhythm: Normal rate and regular rhythm.  ?Pulmonary:  ?   Effort: Pulmonary effort is normal.  ?   Breath sounds: Normal breath sounds. No wheezing.  ?Abdominal:  ?  General: Abdomen is flat. Bowel sounds are normal.  ?   Palpations: Abdomen is soft.  ?   Tenderness: There is no abdominal tenderness.  ?Musculoskeletal:  ?   Cervical back: Normal range of motion and neck supple. No tenderness.  ?Lymphadenopathy:  ?   Cervical: Cervical adenopathy present.  ?   Right cervical: Posterior cervical adenopathy present.  ?   Left cervical: Posterior cervical adenopathy present.  ?Skin: ?   General: Skin is warm and dry.  ?   Capillary Refill: Capillary refill takes less than 2 seconds.  ?Neurological:  ?   Mental Status: She is alert and oriented to person, place, and time.  ? ? ?ED Results /  Procedures / Treatments   ?Labs ?(all labs ordered are listed, but only abnormal results are displayed) ?Labs Reviewed  ?CBC WITH DIFFERENTIAL/PLATELET - Abnormal; Notable for the following components:  ?    Result Value  ? RBC 5.23 (*)   ? MCV 77.8 (*)   ? MCH 24.7 (*)   ? All other components within normal limits  ?BASIC METABOLIC PANEL  ?PREGNANCY, URINE  ? ? ?EKG ?None ? ?Radiology ?CT Soft Tissue Neck W Contrast ? ?Result Date: 07/28/2021 ?CLINICAL DATA:  Initial evaluation for acute soft tissue swelling, infection suspected. EXAM: CT NECK WITH CONTRAST TECHNIQUE: Multidetector CT imaging of the neck was performed using the standard protocol following the bolus administration of intravenous contrast. RADIATION DOSE REDUCTION: This exam was performed according to the departmental dose-optimization program which includes automated exposure control, adjustment of the mA and/or kV according to patient size and/or use of iterative reconstruction technique. CONTRAST:  13m OMNIPAQUE IOHEXOL 300 MG/ML  SOLN COMPARISON:  None. FINDINGS: Pharynx and larynx: Oral cavity within normal limits. No acute inflammatory changes seen about the dentition. Oropharynx and nasopharynx within normal limits. No retropharyngeal collection or swelling. Epiglottis normal. Vallecula clear. Hypopharynx and supraglottic larynx within normal limits. Subglottic airway patent clear. Salivary glands: Asymmetric hyperattenuation with irregularity of the right parotid gland with overlying soft tissue swelling and inflammatory stranding within the subcutaneous fat of the right parotid space, suggesting acute parotitis. A slightly more focal area of soft tissue irregularity present at the posterior aspect of the gland itself (series 2, image 38). No obstructive stone. No discrete abscess or collection. Contralateral left parotid gland normal. Submandibular glands normal. Thyroid: Tiny 3 mm left thyroid nodule, of doubtful significance given size and  patient age, no follow-up imaging recommended (ref: J Am Coll Radiol. 2015 Feb;12(2): 143-50).Mild asymmetric prominence of right-sided cervical lymph nodes, largest of which measures 8 mm at level 5, likely reactive. No other enlarged or pathologic adenopathy within the neck. Lymph nodes: Normal intravascular enhancement seen throughout the neck. Vascular: Unremarkable. Limited intracranial: Unremarkable. Visualized orbits: Unremarkable. Mastoids and visualized paranasal sinuses: Mild mucosal thickening noted within the maxillary sinuses. Paranasal sinuses are otherwise clear. Mastoid air cells and middle ear cavities are well pneumatized and free of fluid. Skeleton: No worrisome osseous lesions. Moderate spondylosis present at C5-6 and C6-7. Upper chest: Scattered multifocal ground-glass opacities seen within the partially visualized lungs, nonspecific, but suspicious for possible infection/pneumonitis. Superimposed 7 mm right upper lobe nodule (series 3, image 135), indeterminate. Other: None. IMPRESSION: 1. Findings consistent with acute right parotitis. No obstructive stone or discrete abscess. Note is made of an approximate 2 cm area of focal soft tissue irregularity at the posterior aspect of the right parotid gland. While this finding is favored to be infectious/inflammatory in nature, a  short interval follow-up scan following treatment to ensure that no underlying lesion is present at this location is recommended. 2. Mild asymmetric prominence of right cervical lymph nodes, presumably reactive. 3. Scattered multifocal ground-glass opacities within the partially visualized lungs, nonspecific, but suspicious for possible infection/pneumonitis. 4. Superimposed 7 mm right upper lobe nodule, indeterminate. Recommend a non-contrast Chest CT at 6-12 months. If patient is high risk for malignancy, recommend an additional non-contrast Chest CT at 18-24 months; if patient is low risk for malignancy a non-contrast  Chest CT at 18-24 months is optional. These guidelines do not apply to immunocompromised patients and patients with cancer. Follow up in patients with significant comorbidities as clinically warranted. For lung cancer screenin

## 2021-07-28 NOTE — ED Notes (Signed)
Pt reports "lumps" appearing yesterday on both sides of her neck.  Denies difficulty swallowing or fever.  States it hurts worse when lying down ?

## 2021-07-28 NOTE — ED Triage Notes (Signed)
Pt states she developed lumps on both sides of neck yesterday and states they are painful.  ? ?Denies any difficulty swallowing or fever ?

## 2021-07-28 NOTE — Discharge Instructions (Signed)
Please return to the ED with any new symptoms such as trouble breathing, trouble swallowing, fevers ?Please take antibiotics have sent in for you ?Please read the attached informational guides concerning your diagnosis ?Please follow-up with your PCP at completion of antibiotic course.  It is recommended that you have a follow-up CT scan to ensure cure. ?

## 2021-07-31 ENCOUNTER — Encounter: Payer: Self-pay | Admitting: Family Medicine

## 2021-08-09 ENCOUNTER — Ambulatory Visit: Payer: BC Managed Care – PPO | Admitting: Family Medicine

## 2021-09-14 ENCOUNTER — Other Ambulatory Visit: Payer: Self-pay | Admitting: Family Medicine

## 2021-09-14 DIAGNOSIS — E786 Lipoprotein deficiency: Secondary | ICD-10-CM

## 2021-09-14 DIAGNOSIS — I10 Essential (primary) hypertension: Secondary | ICD-10-CM

## 2021-09-14 DIAGNOSIS — R718 Other abnormality of red blood cells: Secondary | ICD-10-CM

## 2021-09-14 DIAGNOSIS — Z1159 Encounter for screening for other viral diseases: Secondary | ICD-10-CM

## 2021-09-17 ENCOUNTER — Other Ambulatory Visit (INDEPENDENT_AMBULATORY_CARE_PROVIDER_SITE_OTHER): Payer: BC Managed Care – PPO

## 2021-09-17 DIAGNOSIS — R718 Other abnormality of red blood cells: Secondary | ICD-10-CM | POA: Diagnosis not present

## 2021-09-17 DIAGNOSIS — E786 Lipoprotein deficiency: Secondary | ICD-10-CM

## 2021-09-17 DIAGNOSIS — Z1159 Encounter for screening for other viral diseases: Secondary | ICD-10-CM

## 2021-09-17 DIAGNOSIS — I10 Essential (primary) hypertension: Secondary | ICD-10-CM

## 2021-09-17 LAB — MICROALBUMIN / CREATININE URINE RATIO
Creatinine,U: 178 mg/dL
Microalb Creat Ratio: 1.7 mg/g (ref 0.0–30.0)
Microalb, Ur: 3.1 mg/dL — ABNORMAL HIGH (ref 0.0–1.9)

## 2021-09-17 LAB — IBC PANEL
Iron: 37 ug/dL — ABNORMAL LOW (ref 42–145)
Saturation Ratios: 9.5 % — ABNORMAL LOW (ref 20.0–50.0)
TIBC: 389.2 ug/dL (ref 250.0–450.0)
Transferrin: 278 mg/dL (ref 212.0–360.0)

## 2021-09-17 LAB — FERRITIN: Ferritin: 11.1 ng/mL (ref 10.0–291.0)

## 2021-09-17 LAB — LIPID PANEL
Cholesterol: 131 mg/dL (ref 0–200)
HDL: 41.7 mg/dL (ref >39.00)
LDL Cholesterol: 77 mg/dL (ref 0–99)
NonHDL: 89.18
Total CHOL/HDL Ratio: 3
Triglycerides: 62 mg/dL (ref 0.0–149.0)
VLDL: 12.4 mg/dL (ref 0.0–40.0)

## 2021-09-17 LAB — TSH: TSH: 1.19 u[IU]/mL (ref 0.35–5.50)

## 2021-09-18 LAB — HEPATITIS C ANTIBODY
Hepatitis C Ab: NONREACTIVE
SIGNAL TO CUT-OFF: 0.12 (ref <1.00)

## 2021-09-24 ENCOUNTER — Encounter: Payer: Self-pay | Admitting: Family Medicine

## 2021-09-24 ENCOUNTER — Ambulatory Visit (INDEPENDENT_AMBULATORY_CARE_PROVIDER_SITE_OTHER): Payer: BC Managed Care – PPO | Admitting: Family Medicine

## 2021-09-24 VITALS — BP 120/86 | HR 58 | Temp 97.3°F | Ht 69.5 in | Wt 262.4 lb

## 2021-09-24 DIAGNOSIS — F411 Generalized anxiety disorder: Secondary | ICD-10-CM | POA: Diagnosis not present

## 2021-09-24 DIAGNOSIS — Z0001 Encounter for general adult medical examination with abnormal findings: Secondary | ICD-10-CM | POA: Diagnosis not present

## 2021-09-24 DIAGNOSIS — Z8679 Personal history of other diseases of the circulatory system: Secondary | ICD-10-CM

## 2021-09-24 DIAGNOSIS — K1121 Acute sialoadenitis: Secondary | ICD-10-CM

## 2021-09-24 DIAGNOSIS — R400 Somnolence: Secondary | ICD-10-CM | POA: Diagnosis not present

## 2021-09-24 DIAGNOSIS — R911 Solitary pulmonary nodule: Secondary | ICD-10-CM

## 2021-09-24 DIAGNOSIS — E786 Lipoprotein deficiency: Secondary | ICD-10-CM

## 2021-09-24 DIAGNOSIS — E611 Iron deficiency: Secondary | ICD-10-CM

## 2021-09-24 MED ORDER — BUSPIRONE HCL 5 MG PO TABS: 5.0000 mg | ORAL_TABLET | Freq: Two times a day (BID) | ORAL | 3 refills | Status: DC

## 2021-09-24 MED ORDER — ESCITALOPRAM OXALATE 20 MG PO TABS: 20.0000 mg | ORAL_TABLET | Freq: Every day | ORAL | 3 refills | Status: DC

## 2021-09-24 MED ORDER — IRON (FERROUS SULFATE) 325 (65 FE) MG PO TABS: 325.0000 mg | ORAL_TABLET | ORAL | Status: AC

## 2021-09-24 NOTE — Assessment & Plan Note (Signed)
Recent ER visit reviewed, as well as CT scan findings. On exam reassuring right parotid without residual mass or tenderness- will not repeat CT at this time.  She will let us know if new symptoms develop to order repeat imaging.

## 2021-09-24 NOTE — Patient Instructions (Addendum)
Continue lexapro '20mg'$  daily. Add buspar '5mg'$  twice daily, update [place] with effect.  Return in 3 months for follow up visit.  Your iron levels were low - start oral iron (ferrous sulfate '325mg'$  (65FE)) every other day over the counter, let us know if any trouble tolerating this.  You may be due for follow up CT scan lungs in October  Schedule GYN follow up.  We will also refer you to sleep doctor (pulmonology).  Good to see you today.   Health Maintenance, Female Adopting a healthy lifestyle and getting preventive care are important in promoting health and wellness. Ask your health care provider about: The right schedule for you to have regular tests and exams. Things you can do on your own to prevent diseases and keep yourself healthy. What should I know about diet, weight, and exercise? Eat a healthy diet  Eat a diet that includes plenty of vegetables, fruits, low-fat dairy products, and lean protein. Do not eat a lot of foods that are high in solid fats, added sugars, or sodium. Maintain a healthy weight Body mass index (BMI) is used to identify weight problems. It estimates body fat based on height and weight. Your health care provider can help determine your BMI and help you achieve or maintain a healthy weight. Get regular exercise Get regular exercise. This is one of the most important things you can do for your health. Most adults should: Exercise for at least 150 minutes each week. The exercise should increase your heart rate and make you sweat (moderate-intensity exercise). Do strengthening exercises at least twice a week. This is in addition to the moderate-intensity exercise. Spend less time sitting. Even light physical activity can be beneficial. Watch cholesterol and blood lipids Have your blood tested for lipids and cholesterol at 43 years of age, then have this test every 5 years. Have your cholesterol levels checked more often if: Your lipid or cholesterol levels are  high. You are older than 43 years of age. You are at high risk for heart disease. What should I know about cancer screening? Depending on your health history and family history, you may need to have cancer screening at various ages. This may include screening for: Breast cancer. Cervical cancer. Colorectal cancer. Skin cancer. Lung cancer. What should I know about heart disease, diabetes, and high blood pressure? Blood pressure and heart disease High blood pressure causes heart disease and increases the risk of stroke. This is more likely to develop in people who have high blood pressure readings or are overweight. Have your blood pressure checked: Every 3-5 years if you are 25-7 years of age. Every year if you are 87 years old or older. Diabetes Have regular diabetes screenings. This checks your fasting blood sugar level. Have the screening done: Once every three years after age 33 if you are at a normal weight and have a low risk for diabetes. More often and at a younger age if you are overweight or have a high risk for diabetes. What should I know about preventing infection? Hepatitis B If you have a higher risk for hepatitis B, you should be screened for this virus. Talk with your health care provider to find out if you are at risk for hepatitis B infection. Hepatitis C Testing is recommended for: Everyone born from [birth year] through 1967. Anyone with known risk factors for hepatitis C. Sexually transmitted infections (STIs) Get screened for STIs, including gonorrhea and chlamydia, if: You are sexually active and are younger than  43 years of age. You are older than 43 years of age and your health care provider tells you that you are at risk for this type of infection. Your sexual activity has changed since you were last screened, and you are at increased risk for chlamydia or gonorrhea. Ask your health care provider if you are at risk. Ask your health care provider about whether you  are at high risk for HIV. Your health care provider may recommend a prescription medicine to help prevent HIV infection. If you choose to take medicine to prevent HIV, you should first get tested for HIV. You should then be tested every 3 months for as long as you are taking the medicine. Pregnancy If you are about to stop having your period (premenopausal) and you may become pregnant, seek counseling before you get pregnant. Take 400 to 800 micrograms (mcg) of folic acid every day if you become pregnant. Ask for birth control (contraception) if you want to prevent pregnancy. Osteoporosis and menopause Osteoporosis is a disease in which the bones lose minerals and strength with aging. This can result in bone fractures. If you are 92 years old or older, or if you are at risk for osteoporosis and fractures, ask your health care provider if you should: Be screened for bone loss. Take a calcium or vitamin D supplement to lower your risk of fractures. Be given hormone replacement therapy (HRT) to treat symptoms of menopause. Follow these instructions at home: Alcohol use Do not drink alcohol if: Your health care provider tells you not to drink. You are pregnant, may be pregnant, or are planning to become pregnant. If you drink alcohol: Limit how much you have to: 0-1 drink a day. Know how much alcohol is in your drink. In the [place] one drink equals one 12 oz bottle of beer (355 mL), one 5 oz glass of wine (148 mL), or one 1 oz glass of hard liquor (44 mL). Lifestyle Do not use any products that contain nicotine or tobacco. These products include cigarettes, chewing tobacco, and vaping devices, such as e-cigarettes. If you need help quitting, ask your health care provider. Do not use street drugs. Do not share needles. Ask your health care provider for help if you need support or information about quitting drugs. General instructions Schedule regular health, dental, and eye exams. Stay current  with your vaccines. Tell your health care provider if: You often feel depressed. You have ever been abused or do not feel safe at home. Summary Adopting a healthy lifestyle and getting preventive care are important in promoting health and wellness. Follow your health care provider's instructions about healthy diet, exercising, and getting tested or screened for diseases. Follow your health care provider's instructions on monitoring your cholesterol and blood pressure. This information is not intended to replace advice given to you by your health care provider. Make sure you discuss any questions you have with your health care provider. Document Revised: 09/03/2020 Document Reviewed: 09/03/2020 Elsevier Patient Education  [place].

## 2021-09-24 NOTE — Assessment & Plan Note (Signed)
Stable period off medication.  

## 2021-09-24 NOTE — Progress Notes (Signed)
Patient ID: Amy Costa, female    DOB: 1981/02/23, 41 y.o.   MRN: [id number]  This visit was conducted in person.  BP 120/86   Pulse (!) 58   Temp (!) 97.3 F (36.3 C) (Temporal)   Ht 5' 9.5" (1.765 m)   Wt 262 lb 6 oz (119 kg)   LMP 09/16/2021   SpO2 97%   BMI 38.19 kg/m    CC: CPE Subjective:   HPI: Amy Costa is a 41 y.o. female presenting on 09/24/2021 for Annual Exam   Notes increased anxiety on lexapro '10mg'$  daily. Interested in higher dose. High family stress - BIL passed away car wreck this year, sister had just gotten married. Sister not doing well. Pt has not seeked counseling previously.   Parotitis 07/2021 seen at [facility] treated with augmentin course.  CT neck with contrast IMPRESSION: 1. Findings consistent with acute right parotitis. No obstructive stone or discrete abscess. Note is made of an approximate 2 cm area of focal soft tissue irregularity at the posterior aspect of the right parotid gland. While this finding is favored to be infectious/inflammatory in nature, a short interval follow-up scan following treatment to ensure that no underlying lesion is present at this location is recommended. 2. Mild asymmetric prominence of right cervical lymph nodes, presumably reactive. 3. Scattered multifocal ground-glass opacities within the partially visualized lungs, nonspecific, but suspicious for possible infection/pneumonitis. 4. Superimposed 7 mm right upper lobe nodule, indeterminate. Recommend a non-contrast Chest CT at 6-12 months. If patient is high risk for malignancy, recommend an additional non-contrast Chest CT at 18-24 months; if patient is low risk for malignancy a non-contrast Chest CT at 18-24 months is optional. These guidelines do not apply to immunocompromised patients and patients with cancer. Follow up in patients with significant comorbidities as clinically warranted. For lung cancer screening, adhere to  Lung-RADS guidelines. Reference: Radiology. 2017; 284(1):228-43.  Loud snorer, notes PNdyspnea, witnessed apnea by husband. Nonrestorative sleep, daytime sleepiness. Can wake up with morning headaches.   Preventative: Well woman exam - with GYN Dr Claudean Kinds - unsure, thinks 09/2016. Due for f/u. S/p BTL.  LMP - this past week, very regular  Flu shot - does not receive COVID vaccine - no Tdap 2013, 08/2016  Seat belt use discussed  Sunscreen use discussed. 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 Interim medical history since our last visit reviewed. Allergies and medications reviewed and updated. Outpatient Medications Prior to Visit  Medication Sig Dispense Refill   escitalopram (LEXAPRO) 20 MG tablet TAKE 1 TABLET BY MOUTH EVERY DAY 90 tablet 1   amLODipine (NORVASC) 5 MG tablet Take 1 tablet (5 mg total) by mouth daily. 30 tablet 11   ondansetron (ZOFRAN) 4 MG tablet Take 1 tablet (4 mg total) by mouth every 8 (eight) hours as needed for nausea or vomiting. 20 tablet 0   No facility-administered medications prior to visit.     Per HPI unless specifically indicated in ROS section below Review of Systems  Constitutional:  Negative for activity change, appetite change, chills, fatigue, fever and unexpected weight change.  HENT:  Positive for sore throat. Negative for hearing loss.   Eyes:  Negative for visual disturbance.  Respiratory:  Positive for cough. Negative for chest tightness, shortness of breath and wheezing.   Cardiovascular:  Negative for chest pain, palpitations and leg swelling.  Gastrointestinal:   Negative for abdominal distention, abdominal pain, blood in stool, constipation, diarrhea, nausea and vomiting.  Genitourinary:  Negative for difficulty urinating and hematuria.  Musculoskeletal:  Negative for arthralgias, myalgias and neck pain.  Skin:  Negative for rash.  Neurological:  Negative for dizziness, seizures, syncope and headaches.  Hematological:  Negative for adenopathy. Does not bruise/bleed easily.  Psychiatric/Behavioral:  Negative for dysphoric mood. The patient is nervous/anxious.    Objective:  BP 120/86   Pulse (!) 58   Temp (!) 97.3 F (36.3 C) (Temporal)   Ht 5' 9.5" (1.765 m)   Wt 262 lb 6 oz (119 kg)   LMP 09/16/2021   SpO2 97%   BMI 38.19 kg/m   Wt Readings from Last 3 Encounters:  09/24/21 262 lb 6 oz (119 kg)  07/28/21 265 lb (120.2 kg)  03/27/20 256 lb 9 oz (116.4 kg)      Physical Exam Vitals and nursing note reviewed.  Constitutional:      Appearance: Normal appearance. She is not ill-appearing.  HENT:     Head: Normocephalic and atraumatic.     Comments: No abnormal parotid mass or swelling or tenderness bilaterally    Right Ear: Tympanic membrane, ear canal and external ear normal. There is no impacted cerumen.     Left Ear: Tympanic membrane, ear canal and external ear normal. There is no impacted cerumen.     Mouth/Throat:     Mouth: Mucous membranes are moist.     Pharynx: Oropharynx is clear. No oropharyngeal exudate or posterior oropharyngeal erythema.  Eyes:     General:        Right eye: No discharge.        Left eye: No discharge.     Extraocular Movements: Extraocular movements intact.     Conjunctiva/sclera: Conjunctivae normal.     Pupils: Pupils are equal, round, and reactive to light.  Neck:     Thyroid: No thyroid mass or thyromegaly.  Cardiovascular:     Rate and Rhythm: Normal rate and regular rhythm.     Pulses: Normal pulses.     Heart sounds: Normal heart sounds. No murmur heard. Pulmonary:     Effort: Pulmonary  effort is normal. No respiratory distress.     Breath sounds: Normal breath sounds. No wheezing, rhonchi or rales.  Abdominal:     General: Bowel sounds are normal. There is no distension.     Palpations: Abdomen is soft. There is no mass.     Tenderness: There is no abdominal tenderness. There is no guarding or rebound.     Hernia: No hernia is present.  Musculoskeletal:     Cervical back: Normal range of motion and neck supple. No rigidity or tenderness.     Right lower leg: No edema.     Left lower leg: No edema.  Lymphadenopathy:     Cervical: No cervical adenopathy.  Skin:    General: Skin is warm and dry.     Findings: No rash.  Neurological:     General: No focal deficit present.     Mental Status: She is alert. Mental status is at baseline.  Psychiatric:        Mood and Affect:  Mood normal.        Behavior: Behavior normal.      Results for orders placed or performed in visit on 09/17/21  IBC panel  Result Value Ref Range   Iron 37 (L) 42 - 145 ug/dL   Transferrin 278.0 212.0 - 360.0 mg/dL   Saturation Ratios 9.5 (L) 20.0 - 50.0 %   TIBC 389.2 250.0 - 450.0 mcg/dL  Ferritin  Result Value Ref Range   Ferritin 11.1 10.0 - 291.0 ng/mL  TSH  Result Value Ref Range   TSH 1.19 0.35 - 5.50 uIU/mL  Hepatitis C antibody  Result Value Ref Range   Hepatitis C Ab NON-REACTIVE NON-REACTIVE   SIGNAL TO CUT-OFF 0.12 <1.00  Microalbumin / creatinine urine ratio  Result Value Ref Range   Microalb, Ur 3.1 (H) 0.0 - 1.9 mg/dL   Creatinine,U 178.0 mg/dL   Microalb Creat Ratio 1.7 0.0 - 30.0 mg/g  Lipid panel  Result Value Ref Range   Cholesterol 131 0 - 200 mg/dL   Triglycerides 62.0 0.0 - 149.0 mg/dL   HDL 41.70 >39.00 mg/dL   VLDL 12.4 0.0 - 40.0 mg/dL   LDL Cholesterol 77 0 - 99 mg/dL   Total CHOL/HDL Ratio 3    NonHDL 89.18       09/24/2021    9:06 AM 10/04/2019   11:20 AM 09/15/2017    4:05 PM 09/15/2017    3:04 PM 10/12/2013   11:06 AM  Depression screen PHQ 2/9   Decreased Interest 1 0 1 0 1  Down, Depressed, Hopeless 1 0 1 0 2  PHQ - 2 Score 2 0 2 0 3  Altered sleeping 1 0 0  0  Tired, decreased energy '2 1 3  2  '$ Change in appetite 0 0 1  0  Feeling bad or failure about yourself  0 0 1  0  Trouble concentrating '2 1 1  2  '$ Moving slowly or fidgety/restless 0 0 0  0  Suicidal thoughts 0 0 0  0  PHQ-9 Score '7 2 8  7  '$ Difficult doing work/chores Somewhat difficult           09/24/2021    9:06 AM 10/04/2019   11:20 AM 09/15/2017    4:06 PM  GAD 7 : Generalized Anxiety Score  Nervous, Anxious, on Edge '2 2 2  '$ Control/stop worrying '3 1 3  '$ Worry too much - different things '3 2 3  '$ Trouble relaxing '2 1 3  '$ Restless '2 1 2  '$ Easily annoyed or irritable '2 2 3  '$ Afraid - awful might happen '2 1 3  '$ Total GAD 7 Score '16 10 19  '$ Anxiety Difficulty Very difficult     Assessment & Plan:   Problem List Items Addressed This Visit     Encounter for general adult medical examination with abnormal findings - Primary (Chronic)    Preventative protocols reviewed and updated unless pt declined. Discussed healthy diet and lifestyle.        History of hypertension    Stable period off medication.        GAD (generalized anxiety disorder)    Chronic, deteriorated with recent family stressors.  Discussed counseling option versus adjuvant antianxiety medication versus monitoring for now.  She opts for second medication-we will start BuSpar 5 mg twice daily, cautioned on dizziness, sedation side effects.  Return 3 months for follow-up visit.  Patient agrees with plan.       Relevant Medications   escitalopram (  LEXAPRO) 20 MG tablet   busPIRone (BUSPAR) 5 MG tablet   Low HDL (under 40)    Cholesterol levels remain well controlled off of medication. The 10-year ASCVD risk score (Arnett DK, et al., 2019) is: 0.5%   Values used to calculate the score:     Age: 12 years     Sex: Female     Is Non-Hispanic African American: No     Diabetic: No     Tobacco smoker:  No     Systolic Blood Pressure: 035 mmHg     Is BP treated: No     HDL Cholesterol: 41.7 mg/dL     Total Cholesterol: 131 mg/dL       Severe obesity (BMI 35.0-39.9) with comorbidity ([place])    Encouraged healthy diet and lifestyle options to affect sustainable weight loss.       Daytime somnolence    Multiple symptoms of OSA-agrees to pulmonology referral.       Relevant Orders   Ambulatory referral to Pulmonology   Acute parotitis    Recent ER visit reviewed, as well as CT scan findings. On exam reassuring right parotid without residual mass or tenderness- will not repeat CT at this time.  She will let us know if new symptoms develop to order repeat imaging.       Lung nodule seen on imaging study    Reviewed recent neck CT showing 7 mm right upper lobe lung nodule-she will be due for repeat CT in approximately 5 months.  We will further discuss at next visit.       Iron deficiency    Newly noted iron deficiency without anemia based on microcytosis on labs. She notes ongoing fatigue but denies symptoms of dyspnea, dizziness or restless leg symptoms. Anticipate related to menstrual bleeding. Recommend start oral over-the-counter ferrous sulfate every other day, discussed side effects to watch for including constipation, nausea.  This can also cause dark stools to develop.  We will reassess with repeat iron panel at follow-up visit in 3 months.         Meds ordered this encounter  Medications   escitalopram (LEXAPRO) 20 MG tablet    Sig: Take 1 tablet (20 mg total) by mouth daily.    Dispense:  90 tablet    Refill:  3   busPIRone (BUSPAR) 5 MG tablet    Sig: Take 1 tablet (5 mg total) by mouth 2 (two) times daily.    Dispense:  60 tablet    Refill:  3   Iron, Ferrous Sulfate, 325 (65 Fe) MG TABS    Sig: Take 325 mg by mouth every Monday, Wednesday, and Friday.   Orders Placed This Encounter  Procedures   Ambulatory referral to Pulmonology    Referral Priority:    Routine    Referral Type:   Consultation    Referral Reason:   Specialty Services Required    Requested Specialty:   Pulmonary Disease    Number of Visits Requested:   1     Patient instructions: Continue lexapro '20mg'$  daily. Add buspar '5mg'$  twice daily, update [place] with effect.  Return in 3 months for follow up visit.  Your iron levels were low - start oral iron (ferrous sulfate '325mg'$  (65FE)) every other day over the counter, let us know if any trouble tolerating this.  You may be due for follow up CT scan lungs in October  Schedule GYN follow up.  We will also refer you to sleep  doctor (pulmonology).  Good to see you today.  Follow up plan: Return in about 3 months (around 12/25/2021) for follow up visit.  Ria Bush, MD

## 2021-09-24 NOTE — Assessment & Plan Note (Signed)
Cholesterol levels remain well controlled off of medication. The 10-year ASCVD risk score (Arnett DK, et al., 2019) is: 0.5%   Values used to calculate the score:     Age: 41 years     Sex: Female     Is Non-Hispanic African American: No     Diabetic: No     Tobacco smoker: No     Systolic Blood Pressure: 334 mmHg     Is BP treated: No     HDL Cholesterol: 41.7 mg/dL     Total Cholesterol: 131 mg/dL

## 2021-09-24 NOTE — Assessment & Plan Note (Signed)
Reviewed recent neck CT showing 7 mm right upper lobe lung nodule-she will be due for repeat CT in approximately 5 months.  We will further discuss at next visit.

## 2021-09-24 NOTE — Assessment & Plan Note (Signed)
Chronic, deteriorated with recent family stressors.  Discussed counseling option versus adjuvant antianxiety medication versus monitoring for now.  She opts for second medication-we will start BuSpar 5 mg twice daily, cautioned on dizziness, sedation side effects.  Return 3 months for follow-up visit.  Patient agrees with plan.

## 2021-09-24 NOTE — Assessment & Plan Note (Signed)
Encouraged healthy diet and lifestyle options to affect sustainable weight loss.

## 2021-09-24 NOTE — Assessment & Plan Note (Signed)
Multiple symptoms of OSA-agrees to pulmonology referral.

## 2021-09-24 NOTE — Assessment & Plan Note (Signed)
Preventative protocols reviewed and updated unless pt declined. Discussed healthy diet and lifestyle.  

## 2021-09-24 NOTE — Assessment & Plan Note (Addendum)
Newly noted iron deficiency without anemia based on microcytosis on labs. She notes ongoing fatigue but denies symptoms of dyspnea, dizziness or restless leg symptoms. Anticipate related to menstrual bleeding. Recommend start oral over-the-counter ferrous sulfate every other day, discussed side effects to watch for including constipation, nausea.  This can also cause dark stools to develop.  We will reassess with repeat iron panel at follow-up visit in 3 months.

## 2021-10-25 ENCOUNTER — Ambulatory Visit (INDEPENDENT_AMBULATORY_CARE_PROVIDER_SITE_OTHER): Payer: BC Managed Care – PPO | Admitting: Pulmonary Disease

## 2021-10-25 ENCOUNTER — Encounter: Payer: Self-pay | Admitting: Pulmonary Disease

## 2021-10-25 VITALS — BP 110/68 | HR 74 | Ht 70.0 in | Wt 265.0 lb

## 2021-10-25 DIAGNOSIS — R0683 Snoring: Secondary | ICD-10-CM | POA: Diagnosis not present

## 2021-10-25 NOTE — Patient Instructions (Signed)
Schedule for home sleep study  Weight loss efforts as tolerated  Call us with significant concerns  I will see you 3 months from here

## 2021-10-25 NOTE — Progress Notes (Signed)
Amy Costa    [id number]    [birth year]  Primary Care Physician:Gutierrez, Garlon Hatchet, MD  Referring Physician: Ria Bush, MD [address] [place],  [place] [address]  Chief complaint:   Patient being seen for longstanding history of snoring, daytime sleepiness  HPI:  Longstanding history of snoring, no witnessed apneas Usually goes to bed between 10 and 11 PM Falls asleep in 30 minutes to an hour 2-3 awakenings Final wake up time about 6:30 AM  Weight has been relatively stable  Admits to dryness of her mouth in the mornings Occasional headaches Memory is good No night sweats Can probably tolerate about 2 hours of driving when she is at a good nights rest  She does drink caffeinated beverages but not generally to get her going in the morning never smoker  History of anxiety/depression  Outpatient Encounter Medications as of 10/25/2021  Medication Sig   busPIRone (BUSPAR) 5 MG tablet Take 1 tablet (5 mg total) by mouth 2 (two) times daily.   escitalopram (LEXAPRO) 20 MG tablet Take 1 tablet (20 mg total) by mouth daily.   Iron, Ferrous Sulfate, 325 (65 Fe) MG TABS Take 325 mg by mouth every Monday, Wednesday, and Friday.   [DISCONTINUED] atenolol (TENORMIN) 100 MG tablet Take 1 tablet (100 mg total) by mouth daily.   [DISCONTINUED] norethindrone-ethinyl estradiol-iron (LOESTRIN FE 1.5/30) 1.5-30 MG-MCG tablet Take 1 tablet by mouth daily.     No facility-administered encounter medications on file as of 10/25/2021.    Allergies as of 10/25/2021   (No Known Allergies)    Past Medical History:  Diagnosis Date   Anxiety    COVID-19 virus infection 11/2019   Depression    on zoloft for years   HPV (human papilloma virus) infection    Hypertension    Migraine    Dr Nelva Bush at [facility] ortho   Pre-eclampsia     Past Surgical History:  Procedure Laterality Date   ARM WOUND REPAIR / CLOSURE     CESAREAN SECTION  01/21/2012   Procedure:  CESAREAN SECTION;  Surgeon: Margarette Asal, MD;  Location: [facility];  Service: Obstetrics;  Laterality: N/A;   CESAREAN SECTION WITH BILATERAL TUBAL LIGATION Bilateral 09/09/2016   Procedure: CESAREAN SECTION WITH BILATERAL TUBAL LIGATION;  Surgeon: Molli Posey, MD;  Location: [facility];  Service: Obstetrics;  Laterality: Bilateral;  Repeat edc 09/17/16 nkda Jill Side, RNFA   MRI  2010   [place] - showing bulging disc C5/6   MYOMECTOMY  01/21/2012   Procedure: MYOMECTOMY;  Surgeon: Margarette Asal, MD;  Location: [facility];  Service: Obstetrics;  Laterality: N/A;    Family History  Problem Relation Age of Onset   COPD Father        + smoker   Heart failure Father        CHF   Hypertension Father    Stroke Father    Seizures Father    Lung cancer Father        + smoker   Depression Father    Healthy Mother    Depression Mother    Healthy Sister    Healthy Sister    Diabetes Paternal Grandfather    Alcohol abuse Paternal Grandfather    Depression Maternal Grandmother    Heart attack Maternal Grandfather    Alcohol abuse Maternal Grandfather    Lung cancer Paternal Grandmother        + smoker  Breast cancer Neg Hx     Social History   Socioeconomic History   Marital status: Married    Spouse name: Not on file   Number of children: 0   Years of education: Not on file   Highest education level: Not on file  Occupational History   Occupation: Research scientist (physical sciences): OTHER    Comment: Industrial/product designer  Tobacco Use   Smoking status: Never   Smokeless tobacco: Never  Vaping Use   Vaping Use: Never used  Substance and Sexual Activity   Alcohol use: Yes    Comment: Occasional   Drug use: No   Sexual activity: Yes    Birth control/protection: None  Other Topics Concern   Not on file  Social History Narrative   Lives with fiance and 2 children (2013, 2018), 1 dog   Occ: Therapist, art at [employer]   Activity: walks during [employer]     Diet: good water, fruits/vegetables daily    Social Determinants of Radio broadcast assistant Strain: Not on file  Food Insecurity: Not on file  Transportation Needs: Not on file  Physical Activity: Not on file  Stress: Not on file  Social Connections: Not on file  Intimate Partner Violence: Not on file    Review of Systems  Psychiatric/Behavioral:  Positive for sleep disturbance.     Vitals:   10/25/21 1606  BP: 110/68  Pulse: 74  SpO2: 98%     Physical Exam Constitutional:      Appearance: She is obese.  HENT:     Head: Normocephalic.     Mouth/Throat:     Mouth: Mucous membranes are moist.     Comments: Mallampati 2, crowded oropharynx, Cardiovascular:     Rate and Rhythm: Normal rate and regular rhythm.     Heart sounds: No murmur heard.    No friction rub.  Pulmonary:     Effort: No respiratory distress.     Breath sounds: No stridor. No wheezing or rhonchi.  Musculoskeletal:     Cervical back: No rigidity or tenderness.  Neurological:     Mental Status: She is alert.  Psychiatric:        Mood and Affect: Mood normal.       10/25/2021    4:00 PM  Results of the Epworth flowsheet  Sitting and reading 2  Watching TV 2  Sitting, inactive in a public place (e.g. a theatre or a meeting) 0  As a passenger in a car for an hour without a break 2  Lying down to rest in the afternoon when circumstances permit 2  Sitting and talking to someone 0  Sitting quietly after a lunch without alcohol 0  In a car, while stopped for a few minutes in traffic 0  Total score 8     Data Reviewed: No previous sleep study  Assessment:  Moderate probability of significant obstructive sleep apnea  Excessive daytime sleepiness  Pathophysiology of sleep disordered breathing reviewed with patient Treatment options for sleep disordered breathing reviewed with the patient  Class II obesity  Plan/Recommendations: Schedule patient for a home sleep study  Encourage  weight loss efforts Encourage regular exercises  Tentative follow-up in 3 months  Encouraged to give [place] a call with any significant concerns   Sherrilyn Rist MD [facility] 10/25/2021, 4:25 PM  CC: Ria Bush, MD

## 2021-12-25 ENCOUNTER — Ambulatory Visit: Payer: BC Managed Care – PPO | Admitting: Family Medicine

## 2021-12-31 ENCOUNTER — Ambulatory Visit (INDEPENDENT_AMBULATORY_CARE_PROVIDER_SITE_OTHER): Payer: BC Managed Care – PPO

## 2021-12-31 DIAGNOSIS — G4733 Obstructive sleep apnea (adult) (pediatric): Secondary | ICD-10-CM | POA: Diagnosis not present

## 2021-12-31 DIAGNOSIS — R0683 Snoring: Secondary | ICD-10-CM

## 2022-01-20 ENCOUNTER — Telehealth: Payer: Self-pay | Admitting: Pulmonary Disease

## 2022-01-20 DIAGNOSIS — G4733 Obstructive sleep apnea (adult) (pediatric): Secondary | ICD-10-CM

## 2022-01-20 NOTE — Telephone Encounter (Signed)
Call patient  Sleep study result  Date of study: 12/31/2021  Impression: Severe obstructive sleep apnea Moderately severe oxygen desaturations  Recommendation: DME referral  Recommend CPAP therapy for moderate obstructive sleep apnea  Auto titrating CPAP with pressure settings of 5-20 will be appropriate  Encourage weight loss measures  Follow-up in the office 4 to 6 weeks following initiation of treatment

## 2022-01-23 NOTE — Telephone Encounter (Signed)
I called the patient and she is agreeable to the CPAP. I have placed the CPAP order and she did not have any other questions. Nothing further needed.

## 2022-02-17 DIAGNOSIS — G4733 Obstructive sleep apnea (adult) (pediatric): Secondary | ICD-10-CM | POA: Diagnosis not present

## 2022-03-20 DIAGNOSIS — G4733 Obstructive sleep apnea (adult) (pediatric): Secondary | ICD-10-CM | POA: Diagnosis not present

## 2022-03-24 DIAGNOSIS — G4733 Obstructive sleep apnea (adult) (pediatric): Secondary | ICD-10-CM | POA: Diagnosis not present

## 2022-04-19 DIAGNOSIS — G4733 Obstructive sleep apnea (adult) (pediatric): Secondary | ICD-10-CM | POA: Diagnosis not present

## 2022-04-23 DIAGNOSIS — G4733 Obstructive sleep apnea (adult) (pediatric): Secondary | ICD-10-CM | POA: Diagnosis not present

## 2022-05-20 DIAGNOSIS — G4733 Obstructive sleep apnea (adult) (pediatric): Secondary | ICD-10-CM | POA: Diagnosis not present

## 2022-05-24 DIAGNOSIS — G4733 Obstructive sleep apnea (adult) (pediatric): Secondary | ICD-10-CM | POA: Diagnosis not present

## 2022-06-20 DIAGNOSIS — G4733 Obstructive sleep apnea (adult) (pediatric): Secondary | ICD-10-CM | POA: Diagnosis not present

## 2022-06-26 ENCOUNTER — Encounter: Payer: Self-pay | Admitting: Nurse Practitioner

## 2022-06-26 ENCOUNTER — Ambulatory Visit: Payer: BC Managed Care – PPO | Admitting: Nurse Practitioner

## 2022-06-26 VITALS — BP 120/82 | HR 67 | Temp 98.0°F | Resp 16 | Ht 70.0 in | Wt 269.2 lb

## 2022-06-26 DIAGNOSIS — H6502 Acute serous otitis media, left ear: Secondary | ICD-10-CM

## 2022-06-26 DIAGNOSIS — J029 Acute pharyngitis, unspecified: Secondary | ICD-10-CM | POA: Diagnosis not present

## 2022-06-26 DIAGNOSIS — R051 Acute cough: Secondary | ICD-10-CM | POA: Insufficient documentation

## 2022-06-26 LAB — POCT RAPID STREP A (OFFICE): Rapid Strep A Screen: NEGATIVE

## 2022-06-26 LAB — POC COVID19 BINAXNOW: SARS Coronavirus 2 Ag: NEGATIVE

## 2022-06-26 MED ORDER — BENZONATATE 200 MG PO CAPS: 200.0000 mg | ORAL_CAPSULE | Freq: Three times a day (TID) | ORAL | 0 refills | Status: AC | PRN

## 2022-06-26 MED ORDER — AMOXICILLIN-POT CLAVULANATE 875-125 MG PO TABS: 1.0000 | ORAL_TABLET | Freq: Two times a day (BID) | ORAL | 0 refills | Status: AC

## 2022-06-26 MED ORDER — FLUTICASONE PROPIONATE 50 MCG/ACT NA SUSP: 2.0000 | Freq: Every day | NASAL | 0 refills | Status: AC

## 2022-06-26 NOTE — Patient Instructions (Signed)
Nice to see you today I recommend that you work form home today and tomorrow I have sent in an antibiotic, nasal spray, and cough medication If you do not start improving over the next 2-3 days let me know

## 2022-06-26 NOTE — Progress Notes (Signed)
Acute Office Visit  Subjective:     Patient ID: WALDEAN FRITZLER, female    DOB: 12/27/1980, 42 y.o.   MRN: [id number]  Chief Complaint  Patient presents with   Ear Pain    Left X 2 days   Sore Throat    Since Sunday    Cough    Since Sunday      Patient is in today for sick symptoms with a history of GAD, lung nodule,migraine headaches   Symptoms started on Sunday 06/22/2022 Her son had a cold last week  Covid vaccines: not up to date Flu vaccine: not up to date Sick contacts Has used daytime and night flu pills with some relief   Review of Systems  Constitutional:  Positive for malaise/fatigue. Negative for chills and fever (subjective).  HENT:  Positive for ear pain (left) and sore throat. Negative for sinus pain.   Respiratory:  Positive for cough and shortness of breath (doe and coughing). Negative for sputum production.   Gastrointestinal:  Negative for abdominal pain, diarrhea, nausea and vomiting.  Musculoskeletal:  Negative for joint pain and myalgias.  Neurological:  Negative for headaches.        Objective:    BP 120/82   Pulse 67   Temp 98 F (36.7 C)   Resp 16   Ht '5\' 10"'$  (1.778 m)   Wt 269 lb 4 oz (122.1 kg)   LMP 06/20/2022   SpO2 95%   BMI 38.63 kg/m    Physical Exam Vitals and nursing note reviewed.  Constitutional:      Appearance: Normal appearance.  HENT:     Right Ear: Tympanic membrane, ear canal and external ear normal. There is no impacted cerumen.     Left Ear: Ear canal and external ear normal. There is no impacted cerumen. Tympanic membrane is erythematous and bulging.     Mouth/Throat:     Mouth: Mucous membranes are moist.     Pharynx: Oropharynx is clear. No posterior oropharyngeal erythema.  Cardiovascular:     Rate and Rhythm: Normal rate and regular rhythm.     Heart sounds: Normal heart sounds.  Pulmonary:     Effort: Pulmonary effort is normal.     Breath sounds: Normal breath sounds.  Lymphadenopathy:      Cervical: Cervical adenopathy present.  Neurological:     Mental Status: She is alert.     Results for orders placed or performed in visit on 06/26/22  POC COVID-19 BinaxNow  Result Value Ref Range   SARS Coronavirus 2 Ag Negative Negative  POCT rapid strep A  Result Value Ref Range   Rapid Strep A Screen Negative Negative        Assessment & Plan:   Problem List Items Addressed This Visit       Nervous and Auditory   Acute serous otitis media of left ear - Primary    Acute otitis media treat with Augmentin 875-125 mg twice daily for 7 days.  Patient use over-the-counter analgesics as needed for relief of pain and Flonase to help with the full sensation of her ears.  Follow-up if no improvement      Relevant Medications   amoxicillin-clavulanate (AUGMENTIN) 875-125 MG tablet   fluticasone (FLONASE) 50 MCG/ACT nasal spray     Other   Sore throat    Strep test in office.  Strep test negative.  Can use over-the-counter analgesics as needed rest drink plenty of fluid  Relevant Orders   POCT rapid strep A (Completed)   Acute cough    COVID test in office.  Will write Flonase and Tessalon Perles to 100 mg 3 times daily as needed.  COVID test was negative      Relevant Medications   benzonatate (TESSALON) 200 MG capsule   fluticasone (FLONASE) 50 MCG/ACT nasal spray   Other Relevant Orders   POC COVID-19 BinaxNow (Completed)   POCT rapid strep A (Completed)    Meds ordered this encounter  Medications   amoxicillin-clavulanate (AUGMENTIN) 875-125 MG tablet    Sig: Take 1 tablet by mouth 2 (two) times daily for 7 days.    Dispense:  14 tablet    Refill:  0    Order Specific Question:   Supervising Provider    Answer:   Glori Bickers MARNE A [[id number]]   benzonatate (TESSALON) 200 MG capsule    Sig: Take 1 capsule (200 mg total) by mouth 3 (three) times daily as needed for cough.    Dispense:  21 capsule    Refill:  0    Order Specific Question:   Supervising Provider     Answer:   Glori Bickers MARNE A [[id number]]   fluticasone (FLONASE) 50 MCG/ACT nasal spray    Sig: Place 2 sprays into both nostrils daily.    Dispense:  16 g    Refill:  0    Order Specific Question:   Supervising Provider    Answer:   TOWER, MARNE A [[id number]]    Return if symptoms worsen or fail to improve.  Romilda Garret, NP

## 2022-06-26 NOTE — Assessment & Plan Note (Signed)
Strep test in office.  Strep test negative.  Can use over-the-counter analgesics as needed rest drink plenty of fluid

## 2022-06-26 NOTE — Assessment & Plan Note (Signed)
COVID test in office.  Will write Flonase and Tessalon Perles to 100 mg 3 times daily as needed.  COVID test was negative

## 2022-06-26 NOTE — Assessment & Plan Note (Signed)
Acute otitis media treat with Augmentin 875-125 mg twice daily for 7 days.  Patient use over-the-counter analgesics as needed for relief of pain and Flonase to help with the full sensation of her ears.  Follow-up if no improvement

## 2022-07-02 ENCOUNTER — Other Ambulatory Visit: Payer: Self-pay | Admitting: Family Medicine

## 2022-07-02 DIAGNOSIS — F411 Generalized anxiety disorder: Secondary | ICD-10-CM

## 2022-07-02 NOTE — Telephone Encounter (Signed)
Message from pharmacy:  [place].   Buspirone Last filled:  06/09/22, #60 Last OV:  09/25/22, CPE Next OV:  none

## 2022-07-02 NOTE — Telephone Encounter (Signed)
ERx 

## 2022-07-09 ENCOUNTER — Other Ambulatory Visit: Payer: Self-pay | Admitting: Nurse Practitioner

## 2022-07-09 DIAGNOSIS — R051 Acute cough: Secondary | ICD-10-CM

## 2022-07-09 DIAGNOSIS — H6502 Acute serous otitis media, left ear: Secondary | ICD-10-CM

## 2022-07-19 DIAGNOSIS — G4733 Obstructive sleep apnea (adult) (pediatric): Secondary | ICD-10-CM | POA: Diagnosis not present

## 2022-08-19 DIAGNOSIS — G4733 Obstructive sleep apnea (adult) (pediatric): Secondary | ICD-10-CM | POA: Diagnosis not present

## 2022-09-24 ENCOUNTER — Other Ambulatory Visit: Payer: Self-pay | Admitting: Family Medicine

## 2022-09-24 DIAGNOSIS — F411 Generalized anxiety disorder: Secondary | ICD-10-CM

## 2022-09-24 NOTE — Telephone Encounter (Signed)
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 So she will no longer be under Dr. Timoteo Ex[employer] care due to our office not accepting her new insurance.

## 2022-09-24 NOTE — Telephone Encounter (Signed)
E-scribed refill.  Plz schedule CPE and fasting lab visits to prevent delays in future refills.  

## 2022-11-12 ENCOUNTER — Other Ambulatory Visit: Payer: Self-pay | Admitting: Family Medicine

## 2022-11-12 DIAGNOSIS — Z1231 Encounter for screening mammogram for malignant neoplasm of breast: Secondary | ICD-10-CM

## 2022-12-24 ENCOUNTER — Ambulatory Visit: Payer: BC Managed Care – PPO

## 2024-01-14 ENCOUNTER — Other Ambulatory Visit: Payer: Self-pay | Admitting: Family Medicine

## 2024-01-14 DIAGNOSIS — Z1231 Encounter for screening mammogram for malignant neoplasm of breast: Secondary | ICD-10-CM

## 2024-01-20 ENCOUNTER — Ambulatory Visit: Payer: Self-pay

## 2024-01-29 ENCOUNTER — Ambulatory Visit
Admission: RE | Admit: 2024-01-29 | Discharge: 2024-01-29 | Disposition: A | Discharge status: Home / Self Care | Payer: PRIVATE HEALTH INSURANCE | Source: Ambulatory Visit | Attending: Family Medicine | Admitting: Family Medicine

## 2024-01-29 DIAGNOSIS — Z1231 Encounter for screening mammogram for malignant neoplasm of breast: Secondary | ICD-10-CM
